# Patient Record
Sex: Female | Born: 1970 | Race: White | Hispanic: No | Marital: Married | Smoking: Current every day smoker
Health system: Southern US, Community
[De-identification: ages and names within clinical notes are randomized; demographics above are authoritative.]

## PROBLEM LIST (undated history)

## (undated) DIAGNOSIS — F419 Anxiety disorder, unspecified: Secondary | ICD-10-CM

## (undated) DIAGNOSIS — I10 Essential (primary) hypertension: Secondary | ICD-10-CM

## (undated) DIAGNOSIS — M1990 Unspecified osteoarthritis, unspecified site: Secondary | ICD-10-CM

## (undated) DIAGNOSIS — F32A Depression, unspecified: Secondary | ICD-10-CM

## (undated) DIAGNOSIS — G4730 Sleep apnea, unspecified: Secondary | ICD-10-CM

## (undated) DIAGNOSIS — F329 Major depressive disorder, single episode, unspecified: Secondary | ICD-10-CM

## (undated) HISTORY — DX: Sleep apnea, unspecified: G47.30

## (undated) HISTORY — DX: Essential (primary) hypertension: I10

## (undated) HISTORY — DX: Depression, unspecified: F32.A

## (undated) HISTORY — PX: SALIVARY GLAND SURGERY: SHX768

## (undated) HISTORY — DX: Unspecified osteoarthritis, unspecified site: M19.90

## (undated) HISTORY — DX: Anxiety disorder, unspecified: F41.9

## (undated) HISTORY — DX: Major depressive disorder, single episode, unspecified: F32.9

---

## 2002-10-06 HISTORY — PX: OTHER SURGICAL HISTORY: SHX169

## 2007-10-07 HISTORY — PX: ABLATION: SHX5711

## 2017-04-10 ENCOUNTER — Encounter: Payer: Self-pay | Admitting: Family

## 2017-04-10 ENCOUNTER — Ambulatory Visit (INDEPENDENT_AMBULATORY_CARE_PROVIDER_SITE_OTHER): Payer: BLUE CROSS/BLUE SHIELD | Admitting: Family

## 2017-04-10 VITALS — BP 133/94 | HR 79 | Temp 97.1°F | Ht 62.0 in | Wt 256.8 lb

## 2017-04-10 DIAGNOSIS — J301 Allergic rhinitis due to pollen: Secondary | ICD-10-CM

## 2017-04-10 DIAGNOSIS — H9202 Otalgia, left ear: Secondary | ICD-10-CM

## 2017-04-10 MED ORDER — CETIRIZINE HCL 10 MG PO TABS: 10.0000 mg | ORAL_TABLET | Freq: Every day | ORAL | 11 refills | Status: DC

## 2017-04-10 MED ORDER — FLUTICASONE PROPIONATE 50 MCG/ACT NA SUSP: 2.0000 | Freq: Every day | NASAL | 6 refills | Status: DC

## 2017-04-10 NOTE — Progress Notes (Signed)
   Subjective:    Patient ID: Kaitlin Evans, female    DOB: 11/22/1970, 46 y.o.   MRN: [id number]  Otalgia   There is pain in the left ear. This is a recurrent problem. The current episode started in the past 7 days. The problem occurs every few minutes. The problem has been waxing and waning. There has been no fever. The pain is at a severity of 6/10. The pain is moderate. Pertinent negatives include no coughing, diarrhea, ear discharge, headaches, hearing loss, rhinorrhea or sore throat. She has tried NSAIDs and acetaminophen for the symptoms. The treatment provided mild relief.      Review of Systems  HENT: Positive for ear pain. Negative for ear discharge, hearing loss, rhinorrhea and sore throat.   Respiratory: Negative for cough.   Gastrointestinal: Negative for diarrhea.  Neurological: Negative for headaches.  All other systems reviewed and are negative.  Social History   Social History  . Marital status: Married    Spouse name: N/A  . Number of children: N/A  . Years of education: N/A   Social History Main Topics  . Smoking status: Former Smoker    Quit date: 04/11/2015  . Smokeless tobacco: Never Used  . Alcohol use No  . Drug use: No  . Sexual activity: Not Asked   Other Topics Concern  . None   Social History Narrative  . None   .    Objective:   Physical Exam  Constitutional: She is oriented to person, place, and time. She appears well-developed and well-nourished. No distress.  HENT:  Head: Normocephalic and atraumatic.  Left Ear: There is tenderness. A middle ear effusion is present.  Nose: Nose normal.  Mouth/Throat: Oropharynx is clear and moist.  Eyes: Pupils are equal, round, and reactive to light.  Neck: Normal range of motion. Neck supple. No thyromegaly present.  Cardiovascular: Normal rate, regular rhythm, normal heart sounds and intact distal pulses.   No murmur heard. Pulmonary/Chest: Effort normal and breath sounds normal. No respiratory  distress. She has no wheezes.  Abdominal: Soft. Bowel sounds are normal. She exhibits no distension. There is no tenderness.  Musculoskeletal: Normal range of motion. She exhibits no edema or tenderness.  Neurological: She is alert and oriented to person, place, and time.  Skin: Skin is warm and dry.  Psychiatric: She has a normal mood and affect. Her behavior is normal. Judgment and thought content normal.  Vitals reviewed.    BP (!) 133/94   Pulse 79   Temp (!) 97.1 F (36.2 C)   Ht 5\' 2"  (1.575 m)   Wt 256 lb 12.8 oz (116.5 kg)   BMI 46.97 kg/m      Assessment & Plan:  1. Left ear pain - fluticasone (FLONASE) 50 MCG/ACT nasal spray; Place 2 sprays into both nostrils daily.  Dispense: 16 g; Refill: 6 - cetirizine (ZYRTEC) 10 MG tablet; Take 1 tablet (10 mg total) by mouth daily.  Dispense: 30 tablet; Refill: 11  2. Allergic rhinitis due to pollen, unspecified seasonality - fluticasone (FLONASE) 50 MCG/ACT nasal spray; Place 2 sprays into both nostrils daily.  Dispense: 16 g; Refill: 6 - cetirizine (ZYRTEC) 10 MG tablet; Take 1 tablet (10 mg total) by mouth daily.  Dispense: 30 tablet; Refill: 11  Will try flonase and zyrtec Tylenol as needed RTO if pain does not improve or worsens   Jannifer Rodneyhristy Defne Gerling, FNP

## 2017-04-10 NOTE — Patient Instructions (Signed)

## 2017-04-21 ENCOUNTER — Other Ambulatory Visit: Payer: Self-pay | Admitting: Family

## 2017-04-21 MED ORDER — AMOXICILLIN 875 MG PO TABS: 875.0000 mg | ORAL_TABLET | Freq: Two times a day (BID) | ORAL | 0 refills | Status: DC

## 2017-04-21 NOTE — Telephone Encounter (Signed)
Amoxicillin Prescription sent to pharmacy.

## 2017-04-21 NOTE — Telephone Encounter (Signed)
Patient aware that rx sent to pharmacy. 

## 2017-07-27 ENCOUNTER — Ambulatory Visit (INDEPENDENT_AMBULATORY_CARE_PROVIDER_SITE_OTHER): Payer: 59 | Admitting: Family Medicine

## 2017-07-27 ENCOUNTER — Encounter: Payer: Self-pay | Admitting: Family Medicine

## 2017-07-27 VITALS — BP 120/84 | HR 83 | Temp 98.1°F | Ht 62.0 in | Wt 251.5 lb

## 2017-07-27 DIAGNOSIS — M545 Low back pain, unspecified: Secondary | ICD-10-CM | POA: Insufficient documentation

## 2017-07-27 DIAGNOSIS — Z23 Encounter for immunization: Secondary | ICD-10-CM

## 2017-07-27 DIAGNOSIS — M5442 Lumbago with sciatica, left side: Secondary | ICD-10-CM

## 2017-07-27 DIAGNOSIS — F419 Anxiety disorder, unspecified: Secondary | ICD-10-CM | POA: Diagnosis not present

## 2017-07-27 DIAGNOSIS — Z13220 Encounter for screening for lipoid disorders: Secondary | ICD-10-CM | POA: Diagnosis not present

## 2017-07-27 DIAGNOSIS — I10 Essential (primary) hypertension: Secondary | ICD-10-CM

## 2017-07-27 MED ORDER — LOSARTAN POTASSIUM-HCTZ 100-25 MG PO TABS: 1.0000 | ORAL_TABLET | Freq: Every day | ORAL | 3 refills | Status: DC

## 2017-07-27 MED ORDER — ALPRAZOLAM 0.25 MG PO TABS: 0.2500 mg | ORAL_TABLET | ORAL | 2 refills | Status: DC | PRN

## 2017-07-27 MED ORDER — CYCLOBENZAPRINE HCL 10 MG PO TABS: 10.0000 mg | ORAL_TABLET | Freq: Two times a day (BID) | ORAL | 2 refills | Status: DC | PRN

## 2017-07-27 MED ORDER — DICLOFENAC SODIUM 1 % TD GEL: 2.0000 g | Freq: Four times a day (QID) | TRANSDERMAL | 5 refills | Status: DC

## 2017-07-27 NOTE — Progress Notes (Signed)
BP 120/84   Pulse 83   Temp 98.1 F (36.7 C) (Oral)   Ht '5\' 2"'  (1.575 m)   Wt 251 lb 8 oz (114.1 kg)   BMI 46.00 kg/m    Subjective:    Patient ID: Kaitlin Evans, female    DOB: September 20, 1971, 46 y.o.   MRN: [id number]  HPI: [name] is a 46 y.o. female presenting on 07/27/2017 for Establish Care   HPI Hypertension Patient is currently on losartan-hydrochlorothiazide, and their blood pressure today is 120/84. Patient denies any lightheadedness or dizziness. Patient denies headaches, blurred vision, chest pains, shortness of breath, or weakness. Denies any side effects from medication and is content with current medication.   Anxiety Patient comes in complaining of anxiety and is coming in to establish care with [place] as her PCP.  She has been using Xanax 0.25 mg as needed for anxiety and very in frequently does she need it.  She says she will use sometimes 1 or 2 a month.  She only uses it when it is most needed and she says it does really calm her down.  She denies full-blown panic attacks but just says it starts to build up towards that direction and then she has to take a treatment for it.  She denies any feelings of depression or sadness or suicidal ideations. Depression screen [facility] 2/9 07/27/2017 04/10/2017  Decreased Interest 0 0  Down, Depressed, Hopeless 1 0  PHQ - 2 Score 1 0    Low back pain Patient complains of left low back pain that sometimes comes on to the right.  She denies any radiation down either legs.  She gets this occasionally with certain use of movements.  She has used some over-the-counter topical agents that helps some she has not used ibuprofen too much because she is more sensitive in the stomach.  She denies any fevers or chills or redness or warmth.  Weakness  Relevant past medical, surgical, family and social history reviewed and updated as indicated. Interim medical history since our last visit reviewed. Allergies and medications reviewed and  updated.  Review of Systems  Constitutional: Negative for chills and fever.  Eyes: Negative for visual disturbance.  Respiratory: Negative for chest tightness and shortness of breath.   Cardiovascular: Negative for chest pain and leg swelling.  Musculoskeletal: Positive for back pain. Negative for gait problem and myalgias.  Skin: Negative for rash.  Neurological: Negative for weakness, light-headedness, numbness and headaches.  Psychiatric/Behavioral: Positive for decreased concentration and dysphoric mood. Negative for agitation, behavioral problems, self-injury, sleep disturbance and suicidal ideas. The patient is nervous/anxious.   All other systems reviewed and are negative.   Per HPI unless specifically indicated above  Social History   Social History  . Marital status: Married    Spouse name: N/A  . Number of children: N/A  . Years of education: N/A   Occupational History  . Not on file.   Social History Main Topics  . Smoking status: Smoker, Current Status Unknown    Packs/day: 0.50    Years: 18.00    Types: Cigarettes    Last attempt to quit: 04/11/2015  . Smokeless tobacco: Never Used  . Alcohol use Yes     Comment: occasional  . Drug use: No  . Sexual activity: Yes    Birth control/ protection: Surgical     Comment: surgery endometrial ablation 2009   Other Topics Concern  . Not on file   Social History Narrative  .  No narrative on file    Past Surgical History:  Procedure Laterality Date  . ABLATION  2009  . CESAREAN SECTION  2005  . CESAREAN SECTION  2008  . D & C  2004  . SALIVARY GLAND SURGERY     stones, had it removed    Family History  Problem Relation Age of Onset  . Hypertension Mother   . Early death Father   . Hypertension Brother   . Diabetes Brother   . Hypertension Maternal Grandmother   . Hypertension Paternal Grandmother   . Heart disease Paternal Grandfather   . Early death Paternal Grandfather     Allergies as of  07/27/2017      Reactions   Sulfa Antibiotics Nausea And Vomiting   Latex Rash      Medication List       Accurate as of 07/27/17  9:39 AM. Always use your most recent med list.          ALPRAZolam 0.25 MG tablet Commonly known as:  XANAX Take 1 tablet (0.25 mg total) by mouth as needed for anxiety.   cetirizine 10 MG tablet Commonly known as:  ZYRTEC Take 1 tablet (10 mg total) by mouth daily.   cyclobenzaprine 10 MG tablet Commonly known as:  FLEXERIL Take 1 tablet (10 mg total) by mouth 2 (two) times daily as needed for muscle spasms.   diclofenac sodium 1 % Gel Commonly known as:  VOLTAREN Apply 2 g topically 4 (four) times daily.   fluticasone 50 MCG/ACT nasal spray Commonly known as:  FLONASE Place 2 sprays into both nostrils daily.   losartan-hydrochlorothiazide 100-25 MG tablet Commonly known as:  HYZAAR Take 1 tablet by mouth daily.          Objective:    BP 120/84   Pulse 83   Temp 98.1 F (36.7 C) (Oral)   Ht '5\' 2"'  (1.575 m)   Wt 251 lb 8 oz (114.1 kg)   BMI 46.00 kg/m   Wt Readings from Last 3 Encounters:  07/27/17 251 lb 8 oz (114.1 kg)  04/10/17 256 lb 12.8 oz (116.5 kg)    Physical Exam  Constitutional: She is oriented to person, place, and time. She appears well-developed and well-nourished. No distress.  Eyes: Conjunctivae are normal.  Neck: Neck supple. No thyromegaly present.  Cardiovascular: Normal rate, regular rhythm, normal heart sounds and intact distal pulses.   No murmur heard. Pulmonary/Chest: Effort normal and breath sounds normal. No respiratory distress. She has no wheezes. She has no rales.  Musculoskeletal: Normal range of motion. She exhibits no edema.       Lumbar back: She exhibits tenderness. She exhibits normal range of motion, no bony tenderness and no swelling.       Back:  Lymphadenopathy:    She has no cervical adenopathy.  Neurological: She is alert and oriented to person, place, and time. Coordination  normal.  Skin: Skin is warm and dry. No rash noted. She is not diaphoretic.  Psychiatric: Her behavior is normal. Judgment normal. Her mood appears anxious. She exhibits a depressed mood. She expresses no suicidal ideation. She expresses no suicidal plans.  Nursing note and vitals reviewed.   No results found for this or any previous visit.    Assessment & Plan:   Problem List Items Addressed This Visit      Cardiovascular and Mediastinum   Hypertension - Primary   Relevant Medications   losartan-hydrochlorothiazide (HYZAAR) 100-25 MG tablet  Other Relevant Orders   CMP14+EGFR (Completed)     Other   Low back pain   Relevant Medications   cyclobenzaprine (FLEXERIL) 10 MG tablet   diclofenac sodium (VOLTAREN) 1 % GEL   Anxiety   Relevant Medications   ALPRAZolam (XANAX) 0.25 MG tablet    Other Visit Diagnoses    Lipid screening       Relevant Orders   Lipid panel (Completed)   Need for immunization against influenza       Relevant Orders   Flu Vaccine QUAD 36+ mos IM (Completed)       Follow up plan: Return in about 6 months (around 01/25/2018), or if symptoms worsen or fail to improve, for htn, .  Caryl Pina, MD [facility] 07/27/2017, 9:39 AM

## 2017-07-28 LAB — CMP14+EGFR
ALT: 21 IU/L (ref 0–32)
AST: 18 IU/L (ref 0–40)
Albumin/Globulin Ratio: 1.5 (ref 1.2–2.2)
Albumin: 4 g/dL (ref 3.5–5.5)
Alkaline Phosphatase: 104 IU/L (ref 39–117)
BUN/Creatinine Ratio: 14 (ref 9–23)
BUN: 10 mg/dL (ref 6–24)
Bilirubin Total: 0.4 mg/dL (ref 0.0–1.2)
CALCIUM: 9.5 mg/dL (ref 8.7–10.2)
CO2: 24 mmol/L (ref 20–29)
CREATININE: 0.74 mg/dL (ref 0.57–1.00)
Chloride: 100 mmol/L (ref 96–106)
GFR calc Af Amer: 115 mL/min/{1.73_m2} (ref >59)
GFR, EST NON AFRICAN AMERICAN: 100 mL/min/{1.73_m2} (ref >59)
Globulin, Total: 2.6 g/dL (ref 1.5–4.5)
Glucose: 96 mg/dL (ref 65–99)
Potassium: 4 mmol/L (ref 3.5–5.2)
Sodium: 139 mmol/L (ref 134–144)
TOTAL PROTEIN: 6.6 g/dL (ref 6.0–8.5)

## 2017-07-28 LAB — LIPID PANEL
CHOL/HDL RATIO: 4.2 ratio (ref 0.0–4.4)
Cholesterol, Total: 178 mg/dL (ref 100–199)
HDL: 42 mg/dL (ref >39)
LDL Calculated: 114 mg/dL — ABNORMAL HIGH (ref 0–99)
TRIGLYCERIDES: 110 mg/dL (ref 0–149)
VLDL CHOLESTEROL CAL: 22 mg/dL (ref 5–40)

## 2017-09-18 ENCOUNTER — Ambulatory Visit: Payer: 59 | Admitting: Family Medicine

## 2017-09-18 ENCOUNTER — Encounter: Payer: Self-pay | Admitting: Family Medicine

## 2017-09-18 VITALS — BP 136/91 | HR 99 | Temp 97.0°F | Ht 62.0 in | Wt 253.0 lb

## 2017-09-18 DIAGNOSIS — J0100 Acute maxillary sinusitis, unspecified: Secondary | ICD-10-CM

## 2017-09-18 MED ORDER — FLUCONAZOLE 150 MG PO TABS: ORAL_TABLET | ORAL | 0 refills | Status: DC

## 2017-09-18 MED ORDER — HYDROCODONE-HOMATROPINE 5-1.5 MG/5ML PO SYRP: 5.0000 mL | ORAL_SOLUTION | Freq: Four times a day (QID) | ORAL | 0 refills | Status: DC | PRN

## 2017-09-18 MED ORDER — AMOXICILLIN-POT CLAVULANATE 875-125 MG PO TABS: 1.0000 | ORAL_TABLET | Freq: Two times a day (BID) | ORAL | 0 refills | Status: DC

## 2017-09-18 NOTE — Patient Instructions (Signed)
Great to see you!   Sinusitis, Adult Sinusitis is soreness and inflammation of your sinuses. Sinuses are hollow spaces in the bones around your face. They are located:  Around your eyes.  In the middle of your forehead.  Behind your nose.  In your cheekbones.  Your sinuses and nasal passages are lined with a stringy fluid (mucus). Mucus normally drains out of your sinuses. When your nasal tissues get inflamed or swollen, the mucus can get trapped or blocked so air cannot flow through your sinuses. This lets bacteria, viruses, and funguses grow, and that leads to infection. Follow these instructions at home: Medicines  Take, use, or apply over-the-counter and prescription medicines only as told by your doctor. These may include nasal sprays.  If you were prescribed an antibiotic medicine, take it as told by your doctor. Do not stop taking the antibiotic even if you start to feel better. Hydrate and Humidify  Drink enough water to keep your pee (urine) clear or pale yellow.  Use a cool mist humidifier to keep the humidity level in your home above 50%.  Breathe in steam for 10-15 minutes, 3-4 times a day or as told by your doctor. You can do this in the bathroom while a hot shower is running.  Try not to spend time in cool or dry air. Rest  Rest as much as possible.  Sleep with your head raised (elevated).  Make sure to get enough sleep each night. General instructions  Put a warm, moist washcloth on your face 3-4 times a day or as told by your doctor. This will help with discomfort.  Wash your hands often with soap and water. If there is no soap and water, use hand sanitizer.  Do not smoke. Avoid being around people who are smoking (secondhand smoke).  Keep all follow-up visits as told by your doctor. This is important. Contact a doctor if:  You have a fever.  Your symptoms get worse.  Your symptoms do not get better within 10 days. Get help right away if:  You  have a very bad headache.  You cannot stop throwing up (vomiting).  You have pain or swelling around your face or eyes.  You have trouble seeing.  You feel confused.  Your neck is stiff.  You have trouble breathing. This information is not intended to replace advice given to you by your health care provider. Make sure you discuss any questions you have with your health care provider. Document Released: 03/10/2008 Document Revised: 05/18/2016 Document Reviewed: 07/18/2015 Elsevier Interactive Patient Education  2018 Elsevier Inc.  

## 2017-09-18 NOTE — Progress Notes (Signed)
   HPI  Patient presents today here with cough.  Patient complains of about 2 weeks of symptoms.  She is tried over-the-counter Mucinex without any improvement.  She is also tried NyQuil.  She complains of deep nonproductive cough.  She has bilateral rib pain with cough. She also has sinus pain and pressure.   PMH: Smoking status noted ROS: Per HPI  Objective: BP (!) 136/91   Pulse 99   Temp (!) 97 F (36.1 C) (Oral)   Ht 5\' 2"  (1.575 m)   Wt 253 lb (114.8 kg)   BMI 46.27 kg/m  Gen: NAD, alert, cooperative with exam HEENT: NCAT, tenderness to palpation of bilateral maxillary sinuses, oropharynx moist and clear, nares with turbinates swollen bilaterally CV: RRR, good S1/S2, no murmur Resp: CTABL, no wheezes, non-labored Ext: No edema, warm Neuro: Alert and oriented, No gross deficits  Assessment and plan:  #Acute sinusitis With possible underlying pneumonia, treated with Augmentin plus Diflucan. Also recommended Flonase Also given Hycodan for nighttime cough    Murtis SinkSam Bradshaw, MD [facility] 09/18/2017, 4:33 PM

## 2017-09-30 ENCOUNTER — Telehealth: Payer: Self-pay | Admitting: Family Medicine

## 2017-09-30 MED ORDER — AMOXICILLIN 500 MG PO CAPS: 1000.0000 mg | ORAL_CAPSULE | Freq: Two times a day (BID) | ORAL | 0 refills | Status: DC

## 2017-09-30 MED ORDER — CLINDAMYCIN HCL 300 MG PO CAPS
300.0000 mg | ORAL_CAPSULE | Freq: Three times a day (TID) | ORAL | 0 refills | Status: DC
Start: 2017-09-30 — End: 2017-12-31

## 2017-09-30 NOTE — Telephone Encounter (Signed)
What symptoms do you have? Sinus pressure and pain and cough and earache  How long have you been sick? First of December  Have you been seen for this problem? YES  If your provider decides to give you a prescription, which pharmacy would you like for it to be sent to[facility] [place]   Patient informed that this information will be sent to the clinical staff for review and that they should receive a follow up call.

## 2017-09-30 NOTE — Telephone Encounter (Signed)
Patient aware.

## 2017-09-30 NOTE — Telephone Encounter (Signed)
Please review and advise.

## 2017-09-30 NOTE — Telephone Encounter (Signed)
Patient aware- states she would like something stronger than amoxicillin sent to pharmacy since she finished Augmentin and it did not get rid of it. Please advise and send back to pools.

## 2017-10-01 ENCOUNTER — Telehealth: Payer: Self-pay | Admitting: Family

## 2017-10-01 MED ORDER — AZITHROMYCIN 250 MG PO TABS: ORAL_TABLET | ORAL | 0 refills | Status: DC

## 2017-10-01 NOTE — Telephone Encounter (Signed)
Refer to phone note from yesterday- please advise

## 2017-10-01 NOTE — Telephone Encounter (Signed)
Patient aware.

## 2017-10-01 NOTE — Telephone Encounter (Signed)
Zpak Prescription sent to pharmacy. If her symptoms do not improve will need to be seen. I added Clindamycin to allergy list.

## 2017-12-31 ENCOUNTER — Ambulatory Visit (INDEPENDENT_AMBULATORY_CARE_PROVIDER_SITE_OTHER): Payer: Self-pay | Admitting: Family

## 2017-12-31 ENCOUNTER — Encounter: Payer: Self-pay | Admitting: Family

## 2017-12-31 VITALS — BP 126/81 | HR 90 | Temp 97.2°F | Ht 62.0 in | Wt 257.8 lb

## 2017-12-31 DIAGNOSIS — J40 Bronchitis, not specified as acute or chronic: Secondary | ICD-10-CM

## 2017-12-31 DIAGNOSIS — J329 Chronic sinusitis, unspecified: Secondary | ICD-10-CM

## 2017-12-31 MED ORDER — AZITHROMYCIN 250 MG PO TABS: ORAL_TABLET | ORAL | 0 refills | Status: DC

## 2017-12-31 NOTE — Progress Notes (Signed)
   Subjective:    Patient ID: Kaitlin Evans, female    DOB: 05/25/1971, 47 y.o.   MRN: [id number]  Sinus Problem  Associated symptoms include chills, coughing, ear pain, headaches and a sore throat.  Cough  This is a new problem. The current episode started 1 to 4 weeks ago. The problem has been gradually worsening. The problem occurs every few minutes. The cough is non-productive. Associated symptoms include chills, ear congestion, ear pain, headaches, myalgias, nasal congestion, postnasal drip, rhinorrhea, a sore throat and wheezing. Pertinent negatives include no fever. The symptoms are aggravated by pollens and lying down. She has tried OTC cough suppressant for the symptoms. The treatment provided mild relief. There is no history of asthma.      Review of Systems  Constitutional: Positive for chills. Negative for fever.  HENT: Positive for ear pain, postnasal drip, rhinorrhea and sore throat.   Respiratory: Positive for cough and wheezing.   Musculoskeletal: Positive for myalgias.  Neurological: Positive for headaches.  All other systems reviewed and are negative.      Objective:   Physical Exam  Constitutional: She is oriented to person, place, and time. She appears well-developed and well-nourished. No distress.  HENT:  Head: Normocephalic and atraumatic.  Right Ear: External ear normal.  Left Ear: External ear normal.  Nose: Mucosal edema and rhinorrhea present. Right sinus exhibits frontal sinus tenderness. Left sinus exhibits frontal sinus tenderness.  Mouth/Throat: Oropharynx is clear and moist.  Eyes: Pupils are equal, round, and reactive to light.  Neck: Normal range of motion. Neck supple. No thyromegaly present.  Cardiovascular: Normal rate, regular rhythm, normal heart sounds and intact distal pulses.  No murmur heard. Pulmonary/Chest: Effort normal and breath sounds normal. No respiratory distress. She has no wheezes.  Intermittent nonproductive   Abdominal:  Soft. Bowel sounds are normal. She exhibits no distension. There is no tenderness.  Musculoskeletal: Normal range of motion. She exhibits no edema or tenderness.  Neurological: She is alert and oriented to person, place, and time.  Skin: Skin is warm and dry.  Psychiatric: She has a normal mood and affect. Her behavior is normal. Judgment and thought content normal.  Vitals reviewed.     BP 126/81   Pulse 90   Temp (!) 97.2 F (36.2 C) (Oral)   Ht 5\' 2"  (1.575 m)   Wt 257 lb 12.8 oz (116.9 kg)   BMI 47.15 kg/m      Assessment & Plan:  1. Sinobronchitis - Take meds as prescribed - Use a cool mist humidifier  -Use saline nose sprays frequently -Force fluids -For any cough or congestion  Use plain Mucinex- regular strength or max strength is fine -For fever or aces or pains- take tylenol or ibuprofen. -Throat lozenges if help - azithromycin (ZITHROMAX) 250 MG tablet; Take 500 mg once, then 250 mg for four days  Dispense: 6 tablet; Refill: 0  2. Morbid obesity (HCC)   Jannifer Rodneyhristy Yesenia Fontenette, FNP

## 2017-12-31 NOTE — Patient Instructions (Signed)

## 2018-03-05 ENCOUNTER — Encounter: Payer: Self-pay | Admitting: Pediatrics

## 2018-03-05 ENCOUNTER — Telehealth: Payer: Self-pay | Admitting: *Deleted

## 2018-03-05 ENCOUNTER — Ambulatory Visit: Payer: BLUE CROSS/BLUE SHIELD | Admitting: Pediatrics

## 2018-03-05 VITALS — BP 123/85 | HR 92 | Temp 97.5°F | Wt 262.8 lb

## 2018-03-05 DIAGNOSIS — J029 Acute pharyngitis, unspecified: Secondary | ICD-10-CM | POA: Diagnosis not present

## 2018-03-05 LAB — RAPID STREP SCREEN (MED CTR MEBANE ONLY): Strep Gp A Ag, IA W/Reflex: NEGATIVE

## 2018-03-05 LAB — CULTURE, GROUP A STREP

## 2018-03-05 MED ORDER — AZITHROMYCIN 250 MG PO TABS: ORAL_TABLET | ORAL | 0 refills | Status: DC

## 2018-03-05 NOTE — Telephone Encounter (Signed)
Patient's son was seen today and diagnosed with Strep.  Mother states that she has a sore throat and fatigue and would like to know if something could be sent in for her

## 2018-03-05 NOTE — Progress Notes (Signed)
  Subjective:   Patient ID: Kaitlin Evans, female    DOB: 12/20/1970, 47 y.o.   MRN: [id number] CC: Sore Throat (fatigue, started on Wed, denies fever or h/a)  HPI: Kaitlin HockMarianne Bilotta is a 47 y.o. female   Started having sore throat this morning.  Son diagnosed earlier today with strep pharyngitis.  He has been drinking out of mom's drinks. More fatigued than usual, appetite is been down.  Some congestion.  No coughing.  Ears feel full.  Relevant past medical, surgical, family and social history reviewed. Allergies and medications reviewed and updated. Social History   Tobacco Use  Smoking Status Former Smoker  . Packs/day: 0.50  . Years: 18.00  . Pack years: 9.00  . Types: Cigarettes  . Last attempt to quit: 04/11/2015  . Years since quitting: 2.9  Smokeless Tobacco Never Used   ROS: Per HPI   Objective:    BP 123/85 (BP Location: Left Arm, Patient Position: Sitting, Cuff Size: Large)   Pulse 92   Temp (!) 97.5 F (36.4 C) (Oral)   Wt 262 lb 12.8 oz (119.2 kg)   BMI 48.07 kg/m   Wt Readings from Last 3 Encounters:  03/05/18 262 lb 12.8 oz (119.2 kg)  12/31/17 257 lb 12.8 oz (116.9 kg)  09/18/17 253 lb (114.8 kg)    Gen: NAD, alert, cooperative with exam, NCAT EYES: EOMI, no conjunctival injection, or no icterus ENT: Left TM with white effusion, right TM dull.  OP with mild erythema LYMPH: no cervical LAD CV: NRRR, normal S1/S2, no murmur, distal pulses 2+ b/l Resp: CTABL, no wheezes, normal WOB Ext: No edema, warm Neuro: Alert and oriented, strength equal b/l UE and LE, coordination grossly normal MSK: normal muscle bulk  Assessment & Plan:  Clerance LavMarianne was seen today for sore throat.  Diagnoses and all orders for this visit:  Pharyngitis, unspecified etiology We will treat with below given recent definite exposure to strep.  Discussed symptom care including daily antihistamine, Flonase. -     azithromycin (ZITHROMAX) 250 MG tablet; Take 2 the first day and then one  each day after.   Follow up plan: As needed Rex Krasarol Vincent, MD [facility]

## 2018-05-22 ENCOUNTER — Encounter: Payer: Self-pay | Admitting: Family Medicine

## 2018-05-22 ENCOUNTER — Ambulatory Visit: Payer: BLUE CROSS/BLUE SHIELD | Admitting: Family Medicine

## 2018-05-22 VITALS — BP 122/82 | HR 94 | Temp 97.2°F | Ht 62.0 in | Wt 265.0 lb

## 2018-05-22 DIAGNOSIS — F411 Generalized anxiety disorder: Secondary | ICD-10-CM | POA: Diagnosis not present

## 2018-05-22 DIAGNOSIS — F3341 Major depressive disorder, recurrent, in partial remission: Secondary | ICD-10-CM | POA: Diagnosis not present

## 2018-05-22 DIAGNOSIS — F410 Panic disorder [episodic paroxysmal anxiety] without agoraphobia: Secondary | ICD-10-CM

## 2018-05-22 MED ORDER — FLUOXETINE HCL 20 MG PO TABS: 20.0000 mg | ORAL_TABLET | Freq: Every day | ORAL | 1 refills | Status: DC

## 2018-05-22 MED ORDER — ALPRAZOLAM 0.5 MG PO TABS: 0.2500 mg | ORAL_TABLET | Freq: Two times a day (BID) | ORAL | 1 refills | Status: DC | PRN

## 2018-05-22 NOTE — Patient Instructions (Signed)
Taking the medicine as directed and not missing any doses is one of the best things you can do to treat your anxiety/ depression.  Here are some things to keep in mind:  1) Side effects (stomach upset, some increased anxiety) may happen before you notice a benefit.  These side effects typically go away over time. 2) Changes to your dose of medicine or a change in medication all together is sometimes necessary 3) Most people need to be on medication at least 12 months 4) Many people will notice an improvement within two weeks but the full effect of the medication can take up to 4-6 weeks 5) Stopping the medication when you start feeling better often results in a return of symptoms 6) Never discontinue your medication without contacting a health care professional first.  Some medications require gradual discontinuation/ taper and can make you sick if you stop them abruptly.  If your symptoms worsen or you have thoughts of suicide/homicide, PLEASE SEEK IMMEDIATE MEDICAL ATTENTION.  You may always call:  National Suicide Hotline: 859-047-7186(848) 317-5523 [place] Crisis Line: 240-543-4223(703) 062-5215 [facility] [place]: 939-077-15479044192514   These are available 24 hours a day, 7 days a week.  See me in 4-6 weeks for recheck or sooner if needed.

## 2018-05-22 NOTE — Progress Notes (Signed)
Subjective: CC: anxiety PCP: Junie SpencerHawks, Christy A, FNP ZOX:[id number]:Kaitlin Evans is a 47 y.o. female presenting to clinic today for:  1. Anxiety Patient with past medical history of anxiety disorder.  She was actually seen in October 2018 for this issue.  At that time, she was prescribed a small quantity of alprazolam 0.25 mg to use as needed.  She was given 2 additional refills but actually did not require refills of the medication.  She notes she took her last tablet yesterday.  She states that her anxiety has been much more prominent since finding out that her [place] from [place] did not apply to the job she recently applied for with [employer].  She describes difficulty falling asleep secondary to ruminating thoughts.  She notes sensation of panic.  Sometimes the sensations of panic are not resolved with the alprazolam 0.25 mg.  She states that that was the case at a recent football game that she attended.  She notes a long-standing history of anxiety and depression with predominance of anxiety symptoms.  She notes that she was initially diagnosed in 2009 and was on medication, Celexa, for quite some time.  She self discontinued over the last year because she was doing really well and felt like she did not need the medicine.  She states that she did feel not quite herself while on the Celexa and does not wish to be on medications if she does not have to.  Family history is significant for depressive disorder in her mother.  She thinks her brother may have had an alcohol use disorder.  And there were reports that her biological father who is now deceased had an alcohol use disorder.  She denies any use of drugs, alcohol or tobacco.  Denies any history of hospitalization for mental health disorder in the past.  No SI, HI, visual or auditory hallucinations.   ROS: Per HPI  Allergies  Allergen Reactions  . Sulfa Antibiotics Nausea And Vomiting  . Clindamycin/Lincomycin Rash  . Latex Rash   Past  Medical History:  Diagnosis Date  . Anxiety   . Arthritis   . Depression   . Hypertension   . Sleep apnea     Current Outpatient Medications:  .  ALPRAZolam (XANAX) 0.25 MG tablet, Take 1 tablet (0.25 mg total) by mouth as needed for anxiety., Disp: 15 tablet, Rfl: 2 .  azithromycin (ZITHROMAX) 250 MG tablet, Take 2 the first day and then one each day after., Disp: 6 tablet, Rfl: 0 .  cetirizine (ZYRTEC) 10 MG tablet, Take 1 tablet (10 mg total) by mouth daily. (Patient taking differently: Take 10 mg by mouth daily as needed. ), Disp: 30 tablet, Rfl: 11 .  cyclobenzaprine (FLEXERIL) 10 MG tablet, Take 1 tablet (10 mg total) by mouth 2 (two) times daily as needed for muscle spasms., Disp: 30 tablet, Rfl: 2 .  diclofenac sodium (VOLTAREN) 1 % GEL, Apply 2 g topically 4 (four) times daily., Disp: 100 g, Rfl: 5 .  fluticasone (FLONASE) 50 MCG/ACT nasal spray, Place 2 sprays into both nostrils daily. (Patient taking differently: Place 2 sprays into both nostrils daily as needed. ), Disp: 16 g, Rfl: 6 .  losartan-hydrochlorothiazide (HYZAAR) 100-25 MG tablet, Take 1 tablet by mouth daily., Disp: 90 tablet, Rfl: 3 Social History   Socioeconomic History  . Marital status: Married    Spouse name: Not on file  . Number of children: Not on file  . Years of education: Not on file  .  Highest education level: Not on file  Occupational History  . Not on file  Social Needs  . Financial resource strain: Not on file  . Food insecurity:    Worry: Not on file    Inability: Not on file  . Transportation needs:    Medical: Not on file    Non-medical: Not on file  Tobacco Use  . Smoking status: Former Smoker    Packs/day: 0.50    Years: 18.00    Pack years: 9.00    Types: Cigarettes    Last attempt to quit: 04/11/2015    Years since quitting: 3.1  . Smokeless tobacco: Never Used  Substance and Sexual Activity  . Alcohol use: Yes    Comment: occasional  . Drug use: No  . Sexual activity: Yes     Birth control/protection: Surgical    Comment: surgery endometrial ablation 2009  Lifestyle  . Physical activity:    Days per week: Not on file    Minutes per session: Not on file  . Stress: Not on file  Relationships  . Social connections:    Talks on phone: Not on file    Gets together: Not on file    Attends religious service: Not on file    Active member of club or organization: Not on file    Attends meetings of clubs or organizations: Not on file    Relationship status: Not on file  . Intimate partner violence:    Fear of current or ex partner: Not on file    Emotionally abused: Not on file    Physically abused: Not on file    Forced sexual activity: Not on file  Other Topics Concern  . Not on file  Social History Narrative  . Not on file   Family History  Problem Relation Age of Onset  . Hypertension Mother   . Early death Father   . Hypertension Brother   . Diabetes Brother   . Hypertension Maternal Grandmother   . Hypertension Paternal Grandmother   . Heart disease Paternal Grandfather   . Early death Paternal Grandfather     Objective: Office vital signs reviewed. BP 122/82   Pulse 94   Temp (!) 97.2 F (36.2 C) (Oral)   Ht 5\' 2"  (1.575 m)   Wt 265 lb (120.2 kg)   BMI 48.47 kg/m   Physical Examination:  General: Awake, alert, obese, anxious and tearful during exam HEENT: Normal    Neck: No goiter.    Eyes: extraocular membranes intact, sclera injected from crying; no exophthalmos    Throat: moist mucus membranes Cardio: regular rate and rhythm, S1S2 heard, no murmurs appreciated Pulm: clear to auscultation bilaterally, no wheezes, rhonchi or rales; normal work of breathing on room air Neuro: No focal neurologic deficits.  Follows commands. Psych: Patient is tearful.  Mood is slightly depressed.  She appears anxious on exam.  Eye contact initially is fair but by the end of the visit is good.  Patient appears to be much more at ease at the end of the  visit.  Does not appear to be responding to internal stimuli.  Depression screen [facility] 2/9 05/22/2018 12/31/2017 09/18/2017  Decreased Interest 1 0 0  Down, Depressed, Hopeless 1 0 0  PHQ - 2 Score 2 0 0  Altered sleeping 2 - -  Tired, decreased energy 2 - -  Change in appetite 2 - -  Feeling bad or failure about yourself  3 - -  Trouble  concentrating 1 - -  Moving slowly or fidgety/restless 1 - -  Suicidal thoughts 0 - -  PHQ-9 Score 13 - -   GAD 7 : Generalized Anxiety Score 05/22/2018  Nervous, Anxious, on Edge 3  Control/stop worrying 3  Worry too much - different things 3  Trouble relaxing 3  Restless 2  Easily annoyed or irritable 3  Afraid - awful might happen 2  Total GAD 7 Score 19  Anxiety Difficulty Very difficult   Mood questionnaire: negative.  Assessment/ Plan: 47 y.o. female   1. Generalized anxiety disorder with panic attacks Likely exacerbated by an acute stress reaction.  Patient scored a 19 on her gad 7 today.  We discussed that I do think that she would benefit from going back on an SSRI.  Since she had some blunting of her personality on Celexa, we discussed Prozac as an alternative.  I counseled her on the need to reject the stigma associated with use of anxiety/ depression medications.  I think that use of a benzodiazepine for as needed panic attacks is appropriate in this patient.  She is well versed on the side effects of benzodiazepines and from chart review appears to be using it very appropriately.  I did review the narcotic database in she indeed only filled it once in October despite having 2 refills on the medication.  She was given an increased dose of 0.5 mg to use half tablet to 1 tablet up to twice daily as needed panic or anxiety.  I suspect that she will not have a great need for this medication as she is stabilized on the Prozac but would likely benefit from it over the next few weeks while her dose is being titrated.  I encouraged her to call with any  questions or concerns.  The national suicide hotline, [place] crisis hotline and [facility] hotlines were also provided to the patient today.  She is to follow-up in about 4 to 6 weeks or sooner if needed for recheck.  2. Recurrent major depressive disorder, in partial remission (HCC) PHQ 9 score of 13 today.  Start Prozac 20 mg daily as above.  She will follow-up in 4 to 6 weeks.   Meds ordered this encounter  Medications  . ALPRAZolam (XANAX) 0.5 MG tablet    Sig: Take 0.5-1 tablets (0.25-0.5 mg total) by mouth 2 (two) times daily as needed for anxiety.    Dispense:  30 tablet    Refill:  1  . FLUoxetine (PROZAC) 20 MG tablet    Sig: Take 1 tablet (20 mg total) by mouth daily.    Dispense:  30 tablet    Refill:  1    The Narcotic Database has been reviewed.  There were no red flags.    Raliegh IpAshly M Banjamin Stovall, DO [facility] [phone]

## 2018-05-25 ENCOUNTER — Telehealth: Payer: Self-pay | Admitting: Family Medicine

## 2018-05-25 NOTE — Telephone Encounter (Signed)
Please advise on medication change

## 2018-05-26 MED ORDER — FLUOXETINE HCL 20 MG PO CAPS: 20.0000 mg | ORAL_CAPSULE | Freq: Every day | ORAL | 1 refills | Status: DC

## 2018-05-26 NOTE — Telephone Encounter (Signed)
I went ahead and took care of it and took tablet out. Thank you

## 2018-05-26 NOTE — Addendum Note (Signed)
Addended by: Cleda DaubUCKER, Gia Lusher G on: 05/26/2018 09:04 AM   Modules accepted: Orders

## 2018-05-26 NOTE — Telephone Encounter (Signed)
Pharmacy was calling about prozac not xanax. Just need it changed from tablets to capsules. I went ahead and sent the change to the pharmacy and notified patient.

## 2018-05-26 NOTE — Telephone Encounter (Signed)
And why is the pharmacy asking for a change on xanax?

## 2018-05-26 NOTE — Telephone Encounter (Signed)
Thank you and do I need to change it in the chart so that next refill will not have this problem

## 2018-06-22 ENCOUNTER — Telehealth: Payer: Self-pay | Admitting: Family

## 2018-06-22 NOTE — Telephone Encounter (Signed)
pLEASE ADVISE ON REFILL FOR MEDICATION.

## 2018-06-22 NOTE — Telephone Encounter (Signed)
PT was suppose to see Dr Nadine CountsGottschalk on 9/19 and had to cancel apt due to just starting a new job and she doesn't get off until around 445 and pt is requesting to come in to see dr Nadine Countsgottschalk in after hours clinic or on Saturday to get a refill on her prozac.  Pt states that she will need a refill on the prozac as well she only has 5 days worth, she uses [facility] [place]

## 2018-06-24 ENCOUNTER — Ambulatory Visit: Payer: BLUE CROSS/BLUE SHIELD | Admitting: Family Medicine

## 2018-06-24 MED ORDER — FLUOXETINE HCL 20 MG PO CAPS: 20.0000 mg | ORAL_CAPSULE | Freq: Every day | ORAL | 0 refills | Status: DC

## 2018-06-24 NOTE — Telephone Encounter (Signed)
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 It is ok to schedule her.  30 days sent to her pharmacy.

## 2018-06-24 NOTE — Telephone Encounter (Signed)
Apt made for tomorrow at 6pm.

## 2018-06-25 ENCOUNTER — Telehealth: Payer: Self-pay

## 2018-06-25 ENCOUNTER — Ambulatory Visit: Payer: BLUE CROSS/BLUE SHIELD

## 2018-06-25 NOTE — Telephone Encounter (Signed)
Patient would like to follow up with Dr. Louanne Skyeettinger about her anxiety. Wanting to come after house - please advise if that is okay

## 2018-06-28 NOTE — Telephone Encounter (Signed)
Yes patient can see me, I am on Friday night this week if she would like to see me after hours.

## 2018-06-28 NOTE — Telephone Encounter (Signed)
appt made for Friday = pt aware

## 2018-07-02 ENCOUNTER — Ambulatory Visit: Payer: BLUE CROSS/BLUE SHIELD | Admitting: Family Medicine

## 2018-07-02 ENCOUNTER — Encounter: Payer: Self-pay | Admitting: Family Medicine

## 2018-07-02 ENCOUNTER — Encounter (INDEPENDENT_AMBULATORY_CARE_PROVIDER_SITE_OTHER): Payer: Self-pay

## 2018-07-02 VITALS — BP 113/81 | HR 90 | Temp 97.0°F | Ht 62.0 in | Wt 264.0 lb

## 2018-07-02 DIAGNOSIS — I10 Essential (primary) hypertension: Secondary | ICD-10-CM

## 2018-07-02 DIAGNOSIS — F419 Anxiety disorder, unspecified: Secondary | ICD-10-CM

## 2018-07-02 DIAGNOSIS — F320 Major depressive disorder, single episode, mild: Secondary | ICD-10-CM

## 2018-07-02 MED ORDER — FLUOXETINE HCL 20 MG PO CAPS: 20.0000 mg | ORAL_CAPSULE | Freq: Every day | ORAL | 1 refills | Status: DC

## 2018-07-02 MED ORDER — CYCLOBENZAPRINE HCL 10 MG PO TABS: 10.0000 mg | ORAL_TABLET | Freq: Two times a day (BID) | ORAL | 2 refills | Status: DC | PRN

## 2018-07-02 MED ORDER — LOSARTAN POTASSIUM-HCTZ 100-25 MG PO TABS: 1.0000 | ORAL_TABLET | Freq: Every day | ORAL | 3 refills | Status: DC

## 2018-07-02 MED ORDER — ALPRAZOLAM 0.5 MG PO TABS: 0.2500 mg | ORAL_TABLET | Freq: Two times a day (BID) | ORAL | 2 refills | Status: DC | PRN

## 2018-07-02 NOTE — Progress Notes (Signed)
BP 113/81   Pulse 90   Temp (!) 97 F (36.1 C) (Oral)   Ht 5\' 2"  (1.575 m)   Wt 264 lb (119.7 kg)   BMI 48.29 kg/m    Subjective:    Patient ID: Kaitlin Evans, female    DOB: 03/27/1971, 47 y.o.   MRN: [id number]  HPI: Kaitlin Evans is a 47 y.o. female presenting on 07/02/2018 for Anxiety   HPI Anxiety depression recheck Patient is coming in today for anxiety depression recheck.  She has been using the Prozac and feels like things are going very well on the Prozac and she likes it a lot.  She denies any major side effects from the Prozac except the occasional GI discomfort which she is fine with and it has not bothered her too much.  She says she has used the alprazolam on occasion may be 1-2 times per week but is trying to keep down on the amount that she takes.  She says she is really feeling a lot better than she was.  She says she still has the occasional where the anxiety builds up or she has crying episodes but not anywhere near what she was having before Depression screen [facility] 2/9 07/02/2018 05/22/2018 12/31/2017 09/18/2017 07/27/2017  Decreased Interest 0 1 0 0 0  Down, Depressed, Hopeless 0 1 0 0 1  PHQ - 2 Score 0 2 0 0 1  Altered sleeping - 2 - - -  Tired, decreased energy - 2 - - -  Change in appetite - 2 - - -  Feeling bad or failure about yourself  - 3 - - -  Trouble concentrating - 1 - - -  Moving slowly or fidgety/restless - 1 - - -  Suicidal thoughts - 0 - - -  PHQ-9 Score - 13 - - -    Hypertension Patient is currently on losartan-hydrochlorthiazide, and their blood pressure today is 113/81. Patient denies any lightheadedness or dizziness. Patient denies headaches, blurred vision, chest pains, shortness of breath, or weakness. Denies any side effects from medication and is content with current medication.  Relevant past medical, surgical, family and social history reviewed and updated as indicated. Interim medical history since our last visit  reviewed. Allergies and medications reviewed and updated.  Review of Systems  Constitutional: Negative for chills and fever.  Eyes: Negative for visual disturbance.  Respiratory: Negative for chest tightness and shortness of breath.   Cardiovascular: Negative for chest pain and leg swelling.  Musculoskeletal: Negative for back pain and gait problem.  Skin: Negative for rash.  Neurological: Negative for light-headedness and headaches.  Psychiatric/Behavioral: Positive for dysphoric mood. Negative for agitation, behavioral problems, self-injury, sleep disturbance and suicidal ideas. The patient is nervous/anxious.   All other systems reviewed and are negative.   Per HPI unless specifically indicated above   Allergies as of 07/02/2018      Reactions   Sulfa Antibiotics Nausea And Vomiting   Clindamycin/lincomycin Rash   Latex Rash      Medication List        Accurate as of 07/02/18  6:18 PM. Always use your most recent med list.          ALPRAZolam 0.5 MG tablet Commonly known as:  XANAX Take 0.5-1 tablets (0.25-0.5 mg total) by mouth 2 (two) times daily as needed for anxiety.   cetirizine 10 MG tablet Commonly known as:  ZYRTEC Take 1 tablet (10 mg total) by mouth daily.   cyclobenzaprine 10  MG tablet Commonly known as:  FLEXERIL Take 1 tablet (10 mg total) by mouth 2 (two) times daily as needed for muscle spasms.   FLUoxetine 20 MG capsule Commonly known as:  PROZAC Take 1 capsule (20 mg total) by mouth daily.   fluticasone 50 MCG/ACT nasal spray Commonly known as:  FLONASE Place 2 sprays into both nostrils daily.   losartan-hydrochlorothiazide 100-25 MG tablet Commonly known as:  HYZAAR Take 1 tablet by mouth daily.          Objective:    BP 113/81   Pulse 90   Temp (!) 97 F (36.1 C) (Oral)   Ht 5\' 2"  (1.575 m)   Wt 264 lb (119.7 kg)   BMI 48.29 kg/m   Wt Readings from Last 3 Encounters:  07/02/18 264 lb (119.7 kg)  05/22/18 265 lb (120.2 kg)   03/05/18 262 lb 12.8 oz (119.2 kg)    Physical Exam  Constitutional: She is oriented to person, place, and time. She appears well-developed and well-nourished. No distress.  Eyes: Conjunctivae are normal.  Neck: Neck supple. No thyromegaly present.  Cardiovascular: Normal rate, regular rhythm, normal heart sounds and intact distal pulses.  No murmur heard. Pulmonary/Chest: Effort normal and breath sounds normal. No respiratory distress. She has no wheezes.  Lymphadenopathy:    She has no cervical adenopathy.  Neurological: She is alert and oriented to person, place, and time. Coordination normal.  Skin: Skin is warm and dry. No rash noted. She is not diaphoretic.  Psychiatric: She has a normal mood and affect. Her behavior is normal.  Nursing note and vitals reviewed.       Assessment & Plan:   Problem List Items Addressed This Visit      Cardiovascular and Mediastinum   Hypertension   Relevant Medications   losartan-hydrochlorothiazide (HYZAAR) 100-25 MG tablet     Other   Anxiety - Primary   Relevant Medications   FLUoxetine (PROZAC) 20 MG capsule   ALPRAZolam (XANAX) 0.5 MG tablet   Depression, major, single episode, mild (HCC)   Relevant Medications   FLUoxetine (PROZAC) 20 MG capsule   ALPRAZolam (XANAX) 0.5 MG tablet       Follow up plan: Return in about 3 months (around 10/01/2018), or if symptoms worsen or fail to improve, for Anxiety depression and hypertension follow-up.  Counseling provided for all of the vaccine components No orders of the defined types were placed in this encounter.   Arville Care, MD [facility] 07/02/2018, 6:18 PM

## 2018-07-28 ENCOUNTER — Telehealth: Payer: Self-pay | Admitting: Family Medicine

## 2018-07-28 NOTE — Telephone Encounter (Signed)
I spoke with pharmacy and they will give her the 2 separate losartan 100 and hydrochlorothiazide 25 mg and they will have them ready for her now.

## 2018-07-28 NOTE — Telephone Encounter (Signed)
Patient phone call on her Losartan-HCTZ 100-25 mg Please advise

## 2018-09-20 ENCOUNTER — Encounter: Payer: Self-pay | Admitting: Emergency Medicine

## 2018-09-20 ENCOUNTER — Emergency Department (INDEPENDENT_AMBULATORY_CARE_PROVIDER_SITE_OTHER)
Admission: EM | Admit: 2018-09-20 | Discharge: 2018-09-20 | Disposition: A | Discharge status: Home / Self Care | Payer: Commercial Managed Care - PPO | Source: Home / Self Care | Attending: Emergency Medicine | Admitting: Emergency Medicine

## 2018-09-20 ENCOUNTER — Ambulatory Visit: Payer: BLUE CROSS/BLUE SHIELD

## 2018-09-20 DIAGNOSIS — N12 Tubulo-interstitial nephritis, not specified as acute or chronic: Secondary | ICD-10-CM

## 2018-09-20 DIAGNOSIS — R300 Dysuria: Secondary | ICD-10-CM | POA: Diagnosis not present

## 2018-09-20 LAB — POCT URINALYSIS DIP (MANUAL ENTRY)
Bilirubin, UA: NEGATIVE
Blood, UA: NEGATIVE
Glucose, UA: NEGATIVE mg/dL
Ketones, POC UA: NEGATIVE mg/dL
Leukocytes, UA: NEGATIVE
Nitrite, UA: NEGATIVE
Protein Ur, POC: NEGATIVE mg/dL
Spec Grav, UA: 1.015 (ref 1.010–1.025)
Urobilinogen, UA: 0.2 E.U./dL
pH, UA: 7.5 (ref 5.0–8.0)

## 2018-09-20 MED ORDER — CEFTRIAXONE SODIUM 1 G IJ SOLR
1000.0000 mg | Freq: Once | INTRAMUSCULAR | Status: AC
  Administered 2018-09-20: 1000 mg via INTRAMUSCULAR

## 2018-09-20 MED ORDER — CIPROFLOXACIN HCL 500 MG PO TABS: 500.0000 mg | ORAL_TABLET | Freq: Two times a day (BID) | ORAL | 0 refills | Status: DC

## 2018-09-20 NOTE — Discharge Instructions (Addendum)
Diagnosis likely is urinary tract infection, possibly infection into left kidney which is called pyelonephritis. Also possible is left kidney stone, so you need to use the urine strainer and if we can obtain a kidney stone, will send for analysis. Treatment: We are sending a urine culture.  Rocephin (antibiotic) shot today.  Prescription for Cipro-antibiotic sent to your pharmacy.-I decided not to prescribe Flomax for urinary flow, as there are possible cross reactions between Flomax and Septra which you are allergic to. May use Tylenol for pain.  Drink plenty of fluids. Please read attached instruction/information sheets on pyelonephritis and renal colic/kidney stone. It is important you follow-up with your PCP within 1 week for recheck and to recheck a urinalysis. If you have severe or worsening symptoms, seek medical care or go to emergency room immediately.

## 2018-09-20 NOTE — ED Provider Notes (Signed)
[facility]    CSN: [id number] Arrival date & time: 09/20/18  4098     History   Chief Complaint Chief Complaint  Patient presents with  . Flank Pain    HPI Kaitlin Evans is a 47 y.o. female.    Flank Pain   This is a 47 y.o. female who presents today 2 days of sharp, mild-moderate left flank pain and urinary frequency.  Noted scant blood in urine this morning.  Has slight urgency but denies actual dysuria. The left flank pain is sharp, 4 out of 10 intensity.  May radiate to the left flank but not beyond.  She feels this is not muscular pain as it does not hurt to move or twist.  She tried Excedrin, and that helped a little. No vaginal discharge She feels that she felt warm, but denies actual documented fever.  No chills No definite lower abdominal pain No nausea, but decreased appetite but she is tolerating small amount of liquids and solids. No vomiting Has mild fatigue but no focal neurologic symptoms.  Had headache this morning that resolved after taking Excedrin.  No lightheadedness or syncope. She denies chance of pregnancy.-Had uterine ablation 2009 without any menstrual periods since then.  She denies history of kidney stones.  She states she had UTI few years ago but not recently.     Past Medical History:  Diagnosis Date  . Anxiety   . Arthritis   . Depression   . Hypertension   . Sleep apnea     Patient Active Problem List   Diagnosis Date Noted  . Depression, major, single episode, mild (HCC) 07/02/2018  . Morbid obesity (HCC) 12/31/2017  . Hypertension 07/27/2017  . Low back pain 07/27/2017  . Anxiety 07/27/2017    Past Surgical History:  Procedure Laterality Date  . ABLATION  2009  . CESAREAN SECTION  2005  . CESAREAN SECTION  2008  . D & C  2004  . SALIVARY GLAND SURGERY     stones, had it removed    OB History   No obstetric history on file.    Had uterine ablation 2009 without any menstrual periods since  then.   Home Medications    Prior to Admission medications   Medication Sig Start Date End Date Taking? Authorizing Provider  ALPRAZolam Prudy Feeler) 0.5 MG tablet Take 0.5-1 tablets (0.25-0.5 mg total) by mouth 2 (two) times daily as needed for anxiety. 07/02/18   [name], Elige Radon, MD  cetirizine (ZYRTEC) 10 MG tablet Take 1 tablet (10 mg total) by mouth daily. Patient taking differently: Take 10 mg by mouth daily as needed.  04/10/17   Jannifer Rodney A, FNP  ciprofloxacin (CIPRO) 500 MG tablet Take 1 tablet (500 mg total) by mouth 2 (two) times daily. For 7 days 09/20/18   Lajean Manes, MD  cyclobenzaprine (FLEXERIL) 10 MG tablet Take 1 tablet (10 mg total) by mouth 2 (two) times daily as needed for muscle spasms. 07/02/18   [name], Elige Radon, MD  FLUoxetine (PROZAC) 20 MG capsule Take 1 capsule (20 mg total) by mouth daily. 07/02/18   [name], Elige Radon, MD  fluticasone (FLONASE) 50 MCG/ACT nasal spray Place 2 sprays into both nostrils daily. Patient taking differently: Place 2 sprays into both nostrils daily as needed.  04/10/17   Jannifer Rodney A, FNP  losartan-hydrochlorothiazide (HYZAAR) 100-25 MG tablet Take 1 tablet by mouth daily. 07/02/18   [name], Elige Radon, MD    Family History Family History  Problem Relation Age of Onset  . Hypertension Mother   . Early death Father   . Hypertension Brother   . Diabetes Brother   . Hypertension Maternal Grandmother   . Hypertension Paternal Grandmother   . Heart disease Paternal Grandfather   . Early death Paternal Grandfather     Social History Social History   Tobacco Use  . Smoking status: Current Every Day Smoker    Packs/day: 0.50    Years: 18.00    Pack years: 9.00    Types: Cigarettes    Last attempt to quit: 04/11/2015    Years since quitting: 3.4  . Smokeless tobacco: Never Used  Substance Use Topics  . Alcohol use: Yes    Comment: occasional  . Drug use: No   Smokes a half a pack a day.  Does not drink alcohol to  excess.  Denies using drugs.  Allergies   Sulfa antibiotics; Clindamycin/lincomycin; and Latex   Review of Systems Review of Systems  Genitourinary: Positive for flank pain.  All other systems reviewed and are negative.  Pertinent items noted in HPI and remainder of comprehensive ROS otherwise negative.   Physical Exam Triage Vital Signs ED Triage Vitals  Enc Vitals Group     BP 09/20/18 0953 116/79     Pulse Rate 09/20/18 0953 96     Resp --      Temp 09/20/18 0953 98.3 F (36.8 C)     Temp Source 09/20/18 0953 Oral     SpO2 09/20/18 0953 97 %     Weight 09/20/18 0954 262 lb (118.8 kg)     Height --      Head Circumference --      Peak Flow --      Pain Score 09/20/18 0954 4     Pain Loc --      Pain Edu? --      Excl. in GC? --    No data found.  Updated Vital Signs BP 116/79 (BP Location: Right Arm)   Pulse 96   Temp 98.3 F (36.8 C) (Oral)   Wt 118.8 kg   SpO2 97%   BMI 47.92 kg/m   Physical Exam Vitals signs and nursing note reviewed.  Constitutional:      General: She is not in acute distress. Eyes:     General: No scleral icterus. Neck:     Musculoskeletal: Neck supple.  No adenopathy Cardiovascular:     Rate and Rhythm: Normal rate and regular rhythm.     Heart sounds: Normal heart sounds.  Pulmonary:     Effort: No respiratory distress.     Breath sounds: Normal breath sounds.    Abdomen:    Soft.  Bowel sounds normoactive x4.   Tender to palpation left CVA, left flank and left mid lateral abdomen.  No organomegaly or masses.  No right or left lower quadrant tenderness.  No suprapubic tenderness.  No masses guarding or rebound. Lymphadenopathy:     Cervical: No cervical adenopathy.  Skin:    General: Skin is warm and dry.  No rash Neurological:     Mental Status: She is alert and oriented to person, place, and time.  Cranial nerves intact.    UC Treatments / Results  Labs (all labs ordered are listed, but only abnormal results are  displayed) Labs Reviewed  URINE CULTURE  POCT URINALYSIS DIP (MANUAL ENTRY)   UA today is cloudy, with pus visibly observed, but negative for nitrates, leukocytes, blood  or protein or glucose.   EKG None  Radiology No results found.  Procedures Procedures (including critical care time)  Medications Ordered in UC Medications  cefTRIAXone (ROCEPHIN) injection 1,000 mg (1,000 mg Intramuscular Given 09/20/18 1038)    Initial Impression / Assessment and Plan / UC Course  I have reviewed the triage vital signs and the nursing notes.  Pertinent labs & imaging results that were available during my care of the patient were reviewed by me and considered in my medical decision making (see chart for details).      Final Clinical Impressions(s) / UC Diagnoses   Final diagnoses:  Dysuria  Pyelonephritis   Diagnosis likely is mild left pyelonephritis/UTI ureteritis, but I am also suspicious possible kidney stone, although her pain level is 4 out of 10 and she declines any acute pain medicines here in urgent care. After risk benefits alternatives discussed, she agrees with the following plans, see details and discharge instructions below. AVS printed and verbal instructions given at length. Rocephin 1 g IM stat Urine culture Cipro 500 twice daily Other symptomatic care.  Close follow-up instructed with PCP within 2 to 3 days.  If any red flags or severe symptoms, go to emergency room. She declined any prescription for pain medication, and she prefers to use Tylenol as needed.   Discharge Instructions     Diagnosis likely is urinary tract infection, possibly infection into left kidney which is called pyelonephritis. Also possible is left kidney stone, so you need to use the urine strainer and if we can obtain a kidney stone, will send for analysis. Treatment: We are sending a urine culture.  Rocephin (antibiotic) shot today.  Prescription for Cipro-antibiotic sent to your pharmacy.-I  decided not to prescribe Flomax for urinary flow, as there are possible cross reactions between Flomax and Septra which you are allergic to. May use Tylenol for pain.  Drink plenty of fluids. Please read attached instruction/information sheets on pyelonephritis and renal colic/kidney stone. It is important you follow-up with your PCP within 1 week for recheck and to recheck a urinalysis. If you have severe or worsening symptoms, seek medical care or go to emergency room immediately.    ED Prescriptions    Medication Sig Dispense Auth. Provider   ciprofloxacin (CIPRO) 500 MG tablet Take 1 tablet (500 mg total) by mouth 2 (two) times daily. For 7 days 14 tablet Lajean Manes, MD     Controlled Substance Prescriptions [place] Controlled Substance Registry consulted? Not Applicable   Lajean Manes, MD 09/20/18 1110

## 2018-09-20 NOTE — ED Triage Notes (Signed)
Pt c/o flank pain x2 days and hematuria this am. Denies dysuria.

## 2018-09-21 LAB — URINE CULTURE
MICRO NUMBER:: 91501941
SPECIMEN QUALITY:: ADEQUATE

## 2018-09-22 ENCOUNTER — Telehealth: Payer: Self-pay

## 2018-09-22 NOTE — Telephone Encounter (Signed)
Spoke with patient, gave results of UCX.  Pt stated that she was feeling dizzy this morning, and said she would follow up if needed.

## 2018-10-18 ENCOUNTER — Other Ambulatory Visit: Payer: Self-pay | Admitting: Family Medicine

## 2018-10-26 ENCOUNTER — Other Ambulatory Visit: Payer: Self-pay | Admitting: Family Medicine

## 2018-11-30 ENCOUNTER — Ambulatory Visit (INDEPENDENT_AMBULATORY_CARE_PROVIDER_SITE_OTHER): Payer: Commercial Managed Care - PPO | Admitting: Family Medicine

## 2018-11-30 ENCOUNTER — Encounter: Payer: Self-pay | Admitting: Family Medicine

## 2018-11-30 VITALS — BP 141/91 | HR 93 | Temp 97.6°F | Ht 62.0 in | Wt 267.0 lb

## 2018-11-30 DIAGNOSIS — J0110 Acute frontal sinusitis, unspecified: Secondary | ICD-10-CM | POA: Diagnosis not present

## 2018-11-30 DIAGNOSIS — Z8742 Personal history of other diseases of the female genital tract: Secondary | ICD-10-CM | POA: Diagnosis not present

## 2018-11-30 MED ORDER — FLUTICASONE PROPIONATE 50 MCG/ACT NA SUSP: 2.0000 | Freq: Every day | NASAL | 6 refills | Status: DC

## 2018-11-30 MED ORDER — AMOXICILLIN-POT CLAVULANATE 875-125 MG PO TABS: 1.0000 | ORAL_TABLET | Freq: Two times a day (BID) | ORAL | 0 refills | Status: DC

## 2018-11-30 MED ORDER — FLUCONAZOLE 150 MG PO TABS: 150.0000 mg | ORAL_TABLET | Freq: Once | ORAL | 0 refills | Status: AC

## 2018-11-30 NOTE — Patient Instructions (Signed)

## 2018-11-30 NOTE — Progress Notes (Signed)
    Subjective:     Kaitlin Evans is a 48 y.o. female who presents for evaluation of sinus pain. Symptoms include: congestion, cough, headaches, nasal congestion, post nasal drip, sinus pressure and sore throat. Onset of symptoms was 2 weeks ago. Symptoms have been gradually worsening since that time. Past history is significant for no history of pneumonia or bronchitis. Patient is a smoker  (0.5 ppd x 18 yrs).  The following portions of the patient's history were reviewed and updated as appropriate: allergies, current medications, past family history, past medical history, past social history, past surgical history and problem list.  Review of Systems Constitutional: positive for chills, fatigue, fevers and malaise Eyes: negative Ears, nose, mouth, throat, and face: positive for nasal congestion, sore throat and sinus pressure Respiratory: positive for cough and sputum Cardiovascular: negative Musculoskeletal:negative Neurological: positive for headaches   Objective:    BP (!) 141/91   Pulse 93   Temp 97.6 F (36.4 C)   Ht 5\' 2"  (1.575 m)   Wt 267 lb (121.1 kg)   BMI 48.83 kg/m  General appearance: alert, cooperative and mild distress Head: Normocephalic, without obvious abnormality, atraumatic Eyes: negative Ears: abnormal TM right ear - serous middle ear fluid and abnormal TM left ear - serous middle ear fluid Nose: scant and yellow discharge, moderate congestion, turbinates pink, swollen, mild maxillary sinus tenderness bilateral, moderate frontal sinus tenderness bilateral Throat: abnormal findings: mild oropharyngeal erythema Lungs: clear to auscultation bilaterally Heart: regular rate and rhythm, S1, S2 normal, no murmur, click, rub or gallop Skin: Skin color, texture, turgor normal. No rashes or lesions Neurologic: Grossly normal    Assessment:   [name] was seen today for cough.  Diagnoses and all orders for this visit:  Acute non-recurrent frontal  sinusitis Symptomatic care discussed. Due to ongoing symptoms for 14 days will treat with Augmentin. Medications as prescribed. Report any new or worsening symptoms.  -     amoxicillin-clavulanate (AUGMENTIN) 875-125 MG tablet; Take 1 tablet by mouth 2 (two) times daily. -     fluticasone (FLONASE) 50 MCG/ACT nasal spray; Place 2 sprays into both nostrils daily.  History of vulvovaginitis -     fluconazole (DIFLUCAN) 150 MG tablet; Take 1 tablet (150 mg total) by mouth once for 1 dose.     Plan:    Nasal saline sprays. Neti pot recommended. Instructions given. Nasal steroids per medication orders. Augmentin per medication orders.   Return if symptoms worsen or fail to improve.  The above assessment and management plan was discussed with the patient. The patient verbalized understanding of and has agreed to the management plan. Patient is aware to call the clinic if symptoms fail to improve or worsen. Patient is aware when to return to the clinic for a follow-up visit. Patient educated on when it is appropriate to go to the emergency department.   Kari Baars, FNP-C [facility] [address] [place], [place] [address] [phone]

## 2018-12-01 ENCOUNTER — Other Ambulatory Visit: Payer: Self-pay | Admitting: Family Medicine

## 2018-12-01 ENCOUNTER — Telehealth: Payer: Self-pay | Admitting: Family Medicine

## 2018-12-01 DIAGNOSIS — J0110 Acute frontal sinusitis, unspecified: Secondary | ICD-10-CM

## 2018-12-01 MED ORDER — AMOXICILLIN-POT CLAVULANATE 875-125 MG PO TABS: 1.0000 | ORAL_TABLET | Freq: Two times a day (BID) | ORAL | 0 refills | Status: DC

## 2018-12-01 NOTE — Telephone Encounter (Signed)
Patient was seen yesterday by Marcelino Duster and prescribed Augmentin.  Only two tablets were sent to the pharmacy.  Please advise.

## 2018-12-01 NOTE — Telephone Encounter (Signed)
Patient aware that correct prescription has been sent in 

## 2018-12-01 NOTE — Telephone Encounter (Signed)
Patient aware that correct prescription has been sent in

## 2018-12-01 NOTE — Telephone Encounter (Signed)
I sent in the requested prescription 

## 2018-12-28 ENCOUNTER — Other Ambulatory Visit: Payer: Self-pay

## 2018-12-28 ENCOUNTER — Telehealth (INDEPENDENT_AMBULATORY_CARE_PROVIDER_SITE_OTHER): Payer: Commercial Managed Care - PPO | Admitting: Nurse Practitioner

## 2018-12-28 DIAGNOSIS — J0100 Acute maxillary sinusitis, unspecified: Secondary | ICD-10-CM

## 2018-12-28 MED ORDER — DOXYCYCLINE HYCLATE 100 MG PO TABS: 100.0000 mg | ORAL_TABLET | Freq: Two times a day (BID) | ORAL | 0 refills | Status: DC

## 2018-12-28 NOTE — Progress Notes (Signed)
Virtual Visit via telephone Note  I connected with Kaitlin Evans on 12/28/18 at 4:05 PM by telephone and verified that I am speaking with the correct person using two identifiers. Kaitlin Evans is currently located at home and no one is currently with her during visit. The provider, Mary-Margaret Daphine Deutscher, FNP is located in their office at time of visit.  I discussed the limitations, risks, security and privacy concerns of performing an evaluation and management service by telephone and the availability of in person appointments. I also discussed with the patient that there may be a patient responsible charge related to this service. The patient expressed understanding and agreed to proceed.   History and Present Illness:   Chief Complaint: sinusitis  HPI' Patient calls in stating that she has been fighting her allergies for over 2 weeks with OTC meds. In the last 6 days she has developed sinus pressure, with severe post nasal drip. Her left ear started hurting 2 days ago and sore throat started 3 days ago. She has had a low grade fever for 2 days, around 99.9. she has not taken any OTC meds except allergy meds  Review of Systems - ENT ROS: positive for - headaches, nasal congestion, nasal discharge and sinus pain    Observations/Objective: Alert and oriented- answers all questions appropriately Voice sounds froggy Occasional cough during interview  Assessment and Plan: Kaitlin Evans in today with chief complaint of sinus congestion  1. Acute non-recurrent maxillary sinusitis 1. Take meds as prescribed 2. Use a cool mist humidifier especially during the winter months and when heat has been humid. 3. Use saline nose sprays frequently 4. Saline irrigations of the nose can be very helpful if done frequently.  * 4X daily for 1 week*  * Use of a nettie pot can be helpful with this. Follow directions with this* 5. Drink plenty of fluids 6. Keep thermostat turn down low 7.For  any cough or congestion  Use plain Mucinex- regular strength or max strength is fine   * Children- consult with Pharmacist for dosing 8. For fever or aces or pains- take tylenol or ibuprofen appropriate for age and weight.  * for fevers greater than 101 orally you may alternate ibuprofen and tylenol every  3 hours.   Meds ordered this encounter  Medications  . doxycycline (VIBRA-TABS) 100 MG tablet    Sig: Take 1 tablet (100 mg total) by mouth 2 (two) times daily. 1 po bid    Dispense:  20 tablet    Refill:  0    Order Specific Question:   Supervising Provider    Answer:   Arville Care A [[id number]]      Follow Up Instructions:  prn   I discussed the assessment and treatment plan with the patient. The patient was provided an opportunity to ask questions and all were answered. The patient agreed with the plan and demonstrated an understanding of the instructions.   The patient was advised to call back or seek an in-person evaluation if the symptoms worsen or if the condition fails to improve as anticipated.  The above assessment and management plan was discussed with the patient. The patient verbalized understanding of and has agreed to the management plan. Patient is aware to call the clinic if symptoms persist or worsen. Patient is aware when to return to the clinic for a follow-up visit. Patient educated on when it is appropriate to go to the emergency department.    I provided 10 minutes of  non-face-to-face time during this encounter.    Mary-Margaret Hassell Done, FNP

## 2019-01-18 ENCOUNTER — Other Ambulatory Visit: Payer: Self-pay | Admitting: Family

## 2019-01-18 DIAGNOSIS — J0110 Acute frontal sinusitis, unspecified: Secondary | ICD-10-CM

## 2019-01-27 ENCOUNTER — Other Ambulatory Visit: Payer: Self-pay | Admitting: Family Medicine

## 2019-02-08 ENCOUNTER — Other Ambulatory Visit: Payer: Self-pay | Admitting: Family Medicine

## 2019-02-08 DIAGNOSIS — F419 Anxiety disorder, unspecified: Secondary | ICD-10-CM

## 2019-02-11 ENCOUNTER — Other Ambulatory Visit: Payer: Self-pay

## 2019-02-11 ENCOUNTER — Ambulatory Visit (INDEPENDENT_AMBULATORY_CARE_PROVIDER_SITE_OTHER): Payer: Commercial Managed Care - PPO | Admitting: Family Medicine

## 2019-02-11 ENCOUNTER — Encounter: Payer: Self-pay | Admitting: Family Medicine

## 2019-02-11 VITALS — BP 102/80 | HR 85 | Temp 97.1°F | Ht 62.0 in | Wt 263.8 lb

## 2019-02-11 DIAGNOSIS — I10 Essential (primary) hypertension: Secondary | ICD-10-CM

## 2019-02-11 DIAGNOSIS — F320 Major depressive disorder, single episode, mild: Secondary | ICD-10-CM

## 2019-02-11 DIAGNOSIS — Z13220 Encounter for screening for lipoid disorders: Secondary | ICD-10-CM

## 2019-02-11 DIAGNOSIS — R7303 Prediabetes: Secondary | ICD-10-CM

## 2019-02-11 DIAGNOSIS — F419 Anxiety disorder, unspecified: Secondary | ICD-10-CM | POA: Diagnosis not present

## 2019-02-11 LAB — BAYER DCA HB A1C WAIVED: HB A1C (BAYER DCA - WAIVED): 5 % (ref <7.0)

## 2019-02-11 MED ORDER — HYDROCHLOROTHIAZIDE 25 MG PO TABS: 25.0000 mg | ORAL_TABLET | Freq: Every day | ORAL | 3 refills | Status: DC

## 2019-02-11 MED ORDER — CYCLOBENZAPRINE HCL 10 MG PO TABS: 10.0000 mg | ORAL_TABLET | Freq: Two times a day (BID) | ORAL | 2 refills | Status: DC | PRN

## 2019-02-11 MED ORDER — ALPRAZOLAM 0.5 MG PO TABS: 0.2500 mg | ORAL_TABLET | Freq: Two times a day (BID) | ORAL | 2 refills | Status: DC | PRN

## 2019-02-11 MED ORDER — LOSARTAN POTASSIUM 100 MG PO TABS: 100.0000 mg | ORAL_TABLET | Freq: Every day | ORAL | 3 refills | Status: DC

## 2019-02-11 MED ORDER — FLUOXETINE HCL 20 MG PO CAPS: 20.0000 mg | ORAL_CAPSULE | Freq: Every day | ORAL | 3 refills | Status: DC

## 2019-02-11 NOTE — Progress Notes (Signed)
BP 102/80   Pulse 85   Temp (!) 97.1 F (36.2 C) (Oral)   Ht '5\' 2"'  (1.575 m)   Wt 263 lb 12.8 oz (119.7 kg)   BMI 48.25 kg/m    Subjective:   Patient ID: Kaitlin Evans, female    DOB: Nov 29, 1970, 48 y.o.   MRN: [id number]  HPI: Kaitlin Evans is a 48 y.o. female presenting on 02/11/2019 for Anxiety and Hypertension   HPI Prediabetes Patient comes in today for recheck of his diabetes. Patient has been currently taking no medication currently and her last A1c was 6.0 about 5 months ago and will recheck today.  She feels like she is not been eating as well and has been doing as well and probably has gained weight.. Patient is currently on an ACE inhibitor/ARB. Patient has not seen an ophthalmologist this year. Patient denies any issues with their feet.   Hypertension Patient is currently on losartan and hydrochlorothiazide, and their blood pressure today is 2/80. Patient denies any lightheadedness or dizziness. Patient denies headaches, blurred vision, chest pains, shortness of breath, or weakness. Denies any side effects from medication and is content with current medication.   Anxiety and depression recheck Is coming in for anxiety and depression recheck as well.  She has been on the Prozac and Xanax and feels like it is doing very well for her.  She denies any major suicidal ideations or thoughts of hurting self.  She has been doing okay even with the coronavirus  Relevant past medical, surgical, family and social history reviewed and updated as indicated. Interim medical history since our last visit reviewed. Allergies and medications reviewed and updated.  Review of Systems  Constitutional: Negative for chills and fever.  Eyes: Negative for redness and visual disturbance.  Respiratory: Negative for chest tightness and shortness of breath.   Cardiovascular: Negative for chest pain and leg swelling.  Musculoskeletal: Negative for back pain and gait problem.  Skin: Negative  for rash.  Neurological: Negative for light-headedness and headaches.  Psychiatric/Behavioral: Negative for agitation, behavioral problems, decreased concentration, dysphoric mood, self-injury, sleep disturbance and suicidal ideas. The patient is nervous/anxious.   All other systems reviewed and are negative.   Per HPI unless specifically indicated above   Allergies as of 02/11/2019      Reactions   Sulfa Antibiotics Nausea And Vomiting   Nausea with severe vomiting   Clindamycin/lincomycin Rash   Latex Rash      Medication List       Accurate as of Feb 11, 2019  4:02 PM. If you have any questions, ask your nurse or doctor.        STOP taking these medications   amoxicillin-clavulanate 875-125 MG tablet Commonly known as:  AUGMENTIN Stopped by:  Fransisca Kaufmann Zaine Elsass, MD   doxycycline 100 MG tablet Commonly known as:  VIBRA-TABS Stopped by:  Fransisca Kaufmann Chaquana Nichols, MD     TAKE these medications   ALPRAZolam 0.5 MG tablet Commonly known as:  Xanax Take 0.5-1 tablets (0.25-0.5 mg total) by mouth 2 (two) times daily as needed for anxiety.   cetirizine 10 MG tablet Commonly known as:  ZYRTEC Take 1 tablet (10 mg total) by mouth daily. What changed:    when to take this  reasons to take this   cyclobenzaprine 10 MG tablet Commonly known as:  FLEXERIL Take 1 tablet (10 mg total) by mouth 2 (two) times daily as needed for muscle spasms.   FLUoxetine 20 MG capsule Commonly  known as:  PROZAC Take 1 capsule (20 mg total) by mouth daily.   fluticasone 50 MCG/ACT nasal spray Commonly known as:  FLONASE SPRAY 2 SPRAYS INTO EACH NOSTRIL EVERY DAY   hydrochlorothiazide 25 MG tablet Commonly known as:  HYDRODIURIL TAKE 1 TABLET BY MOUTH EVERY DAY   losartan 100 MG tablet Commonly known as:  COZAAR Take 1 tablet (100 mg total) by mouth daily. (Needs to be seen before next refill)        Objective:   BP 102/80   Pulse 85   Temp (!) 97.1 F (36.2 C) (Oral)   Ht '5\' 2"'   (1.575 m)   Wt 263 lb 12.8 oz (119.7 kg)   BMI 48.25 kg/m   Wt Readings from Last 3 Encounters:  02/11/19 263 lb 12.8 oz (119.7 kg)  11/30/18 267 lb (121.1 kg)  09/20/18 262 lb (118.8 kg)    Physical Exam Vitals signs and nursing note reviewed.  Constitutional:      General: She is not in acute distress.    Appearance: She is well-developed. She is not diaphoretic.  Eyes:     Conjunctiva/sclera: Conjunctivae normal.  Cardiovascular:     Rate and Rhythm: Normal rate and regular rhythm.     Heart sounds: Normal heart sounds. No murmur.  Pulmonary:     Effort: Pulmonary effort is normal. No respiratory distress.     Breath sounds: Normal breath sounds. No wheezing.  Skin:    General: Skin is warm and dry.     Findings: No rash.  Neurological:     Mental Status: She is alert and oriented to person, place, and time.     Coordination: Coordination normal.  Psychiatric:        Mood and Affect: Mood is anxious. Mood is not depressed.        Behavior: Behavior normal.        Thought Content: Thought content does not include suicidal ideation. Thought content does not include suicidal plan.       Assessment & Plan:   Problem List Items Addressed This Visit      Cardiovascular and Mediastinum   Hypertension - Primary   Relevant Medications   hydrochlorothiazide (HYDRODIURIL) 25 MG tablet   losartan (COZAAR) 100 MG tablet   Other Relevant Orders   CBC with Differential/Platelet (Completed)   CMP14+EGFR (Completed)     Other   Anxiety   Relevant Medications   ALPRAZolam (XANAX) 0.5 MG tablet   FLUoxetine (PROZAC) 20 MG capsule   Other Relevant Orders   CBC with Differential/Platelet (Completed)   Depression, major, single episode, mild (HCC)   Relevant Medications   ALPRAZolam (XANAX) 0.5 MG tablet   FLUoxetine (PROZAC) 20 MG capsule    Other Visit Diagnoses    Lipid screening       Relevant Orders   Lipase (Completed)   Prediabetes       Relevant Orders   Bayer  DCA Hb A1c Waived (Completed)      Continue current medication will check blood work, no changes Follow up plan: Return in about 3 months (around 05/14/2019), or if symptoms worsen or fail to improve, for Anxiety and hypertension.  Counseling provided for all of the vaccine components No orders of the defined types were placed in this encounter.   Caryl Pina, MD [facility] 02/11/2019, 4:02 PM

## 2019-02-12 LAB — CBC WITH DIFFERENTIAL/PLATELET
Basophils Absolute: 0.1 10*3/uL (ref 0.0–0.2)
Basos: 1 %
EOS (ABSOLUTE): 0.2 10*3/uL (ref 0.0–0.4)
Eos: 2 %
Hematocrit: 45.3 % (ref 34.0–46.6)
Hemoglobin: 15 g/dL (ref 11.1–15.9)
Immature Grans (Abs): 0 10*3/uL (ref 0.0–0.1)
Immature Granulocytes: 0 %
Lymphocytes Absolute: 4 10*3/uL — ABNORMAL HIGH (ref 0.7–3.1)
Lymphs: 41 %
MCH: 29.5 pg (ref 26.6–33.0)
MCHC: 33.1 g/dL (ref 31.5–35.7)
MCV: 89 fL (ref 79–97)
Monocytes Absolute: 0.7 10*3/uL (ref 0.1–0.9)
Monocytes: 7 %
Neutrophils Absolute: 4.8 10*3/uL (ref 1.4–7.0)
Neutrophils: 49 %
Platelets: 365 10*3/uL (ref 150–450)
RBC: 5.08 x10E6/uL (ref 3.77–5.28)
RDW: 13.5 % (ref 11.7–15.4)
WBC: 9.7 10*3/uL (ref 3.4–10.8)

## 2019-02-12 LAB — CMP14+EGFR
ALT: 20 IU/L (ref 0–32)
AST: 12 IU/L (ref 0–40)
Albumin/Globulin Ratio: 1.9 (ref 1.2–2.2)
Albumin: 4.3 g/dL (ref 3.8–4.8)
Alkaline Phosphatase: 113 IU/L (ref 39–117)
BUN/Creatinine Ratio: 14 (ref 9–23)
BUN: 10 mg/dL (ref 6–24)
Bilirubin Total: 0.3 mg/dL (ref 0.0–1.2)
CO2: 22 mmol/L (ref 20–29)
Calcium: 10 mg/dL (ref 8.7–10.2)
Chloride: 98 mmol/L (ref 96–106)
Creatinine, Ser: 0.73 mg/dL (ref 0.57–1.00)
GFR calc Af Amer: 115 mL/min/{1.73_m2} (ref >59)
GFR calc non Af Amer: 100 mL/min/{1.73_m2} (ref >59)
Globulin, Total: 2.3 g/dL (ref 1.5–4.5)
Glucose: 92 mg/dL (ref 65–99)
Potassium: 3.5 mmol/L (ref 3.5–5.2)
Sodium: 137 mmol/L (ref 134–144)
Total Protein: 6.6 g/dL (ref 6.0–8.5)

## 2019-02-12 LAB — LIPASE: Lipase: 28 U/L (ref 14–72)

## 2019-02-28 ENCOUNTER — Other Ambulatory Visit: Payer: Self-pay | Admitting: Family Medicine

## 2019-03-01 NOTE — Telephone Encounter (Signed)
Pt was just seen 02/11/19 and rx was sent to [facility] since pt states she no longer uses [facility]. Pt doesn't need to follow up till around August 8th and she says she will call back to schedule.

## 2019-03-01 NOTE — Telephone Encounter (Signed)
[name]. NTBS 30 days given 5/8/202

## 2019-03-07 ENCOUNTER — Other Ambulatory Visit: Payer: Self-pay | Admitting: Family Medicine

## 2019-03-07 DIAGNOSIS — F419 Anxiety disorder, unspecified: Secondary | ICD-10-CM

## 2019-03-10 ENCOUNTER — Other Ambulatory Visit: Payer: Self-pay | Admitting: Family Medicine

## 2019-04-27 ENCOUNTER — Other Ambulatory Visit: Payer: Self-pay

## 2019-04-29 ENCOUNTER — Ambulatory Visit: Payer: Commercial Managed Care - PPO | Admitting: Family Medicine

## 2019-05-04 ENCOUNTER — Ambulatory Visit: Payer: Commercial Managed Care - PPO | Admitting: Family Medicine

## 2019-05-22 ENCOUNTER — Emergency Department (INDEPENDENT_AMBULATORY_CARE_PROVIDER_SITE_OTHER)
Admission: EM | Admit: 2019-05-22 | Discharge: 2019-05-22 | Disposition: A | Discharge status: Home / Self Care | Payer: Commercial Managed Care - PPO | Source: Home / Self Care | Attending: Family Medicine | Admitting: Family Medicine

## 2019-05-22 ENCOUNTER — Other Ambulatory Visit: Payer: Self-pay

## 2019-05-22 DIAGNOSIS — L089 Local infection of the skin and subcutaneous tissue, unspecified: Secondary | ICD-10-CM

## 2019-05-22 DIAGNOSIS — L723 Sebaceous cyst: Secondary | ICD-10-CM

## 2019-05-22 MED ORDER — DOXYCYCLINE HYCLATE 100 MG PO CAPS: 100.0000 mg | ORAL_CAPSULE | Freq: Two times a day (BID) | ORAL | 0 refills | Status: DC

## 2019-05-22 NOTE — ED Provider Notes (Signed)
[facility]    CSN: [id number] Arrival date & time: 05/22/19  1248     History   Chief Complaint Chief Complaint  Patient presents with  . Recurrent Skin Infections    HPI Kaitlin Evans is a 48 y.o. female.   Patient developed a "bump" on her left hip about 2 months ago.  During the past several days the lesion has become enlarged, erythematous and painful.  The history is provided by the patient.  Abscess Location:  Torso Torso abscess location: left hip. Size:  1.5cm Abscess quality: fluctuance, painful, redness and warmth   Abscess quality: not draining, no itching and not weeping   Red streaking: no   Duration:  2 months Progression:  Worsening Pain details:    Quality:  Aching   Severity:  Mild   Duration:  2 days   Timing:  Constant   Progression:  Worsening Chronicity:  New Context: not skin injury   Relieved by:  None tried Exacerbated by: contact. Ineffective treatments:  None tried Associated symptoms: no fatigue and no fever     Past Medical History:  Diagnosis Date  . Anxiety   . Arthritis   . Depression   . Hypertension   . Sleep apnea     Patient Active Problem List   Diagnosis Date Noted  . Depression, major, single episode, mild (HCC) 07/02/2018  . Morbid obesity (HCC) 12/31/2017  . Hypertension 07/27/2017  . Low back pain 07/27/2017  . Anxiety 07/27/2017    Past Surgical History:  Procedure Laterality Date  . ABLATION  2009  . CESAREAN SECTION  2005  . CESAREAN SECTION  2008  . D & C  2004  . SALIVARY GLAND SURGERY     stones, had it removed    OB History   No obstetric history on file.      Home Medications    Prior to Admission medications   Medication Sig Start Date End Date Taking? Authorizing Provider  ALPRAZolam Prudy Feeler(XANAX) 0.5 MG tablet Take 0.5-1 tablets (0.25-0.5 mg total) by mouth 2 (two) times daily as needed for anxiety. 02/11/19   [name], Elige RadonJoshua A, MD  cetirizine (ZYRTEC) 10 MG tablet Take 1  tablet (10 mg total) by mouth daily. Patient taking differently: Take 10 mg by mouth daily as needed.  04/10/17   Junie SpencerHawks, Christy A, FNP  cyclobenzaprine (FLEXERIL) 10 MG tablet Take 1 tablet (10 mg total) by mouth 2 (two) times daily as needed for muscle spasms. 02/11/19   [name], Elige RadonJoshua A, MD  doxycycline (VIBRAMYCIN) 100 MG capsule Take 1 capsule (100 mg total) by mouth 2 (two) times daily. Take with food. 05/22/19   Lattie HawBeese, Lakoda Raske A, MD  FLUoxetine (PROZAC) 20 MG capsule Take 1 capsule (20 mg total) by mouth daily. 02/11/19   [name], Elige RadonJoshua A, MD  fluticasone (FLONASE) 50 MCG/ACT nasal spray SPRAY 2 SPRAYS INTO EACH NOSTRIL EVERY DAY 01/18/19   Sonny Mastersakes, Linda M, FNP  hydrochlorothiazide (HYDRODIURIL) 25 MG tablet Take 1 tablet (25 mg total) by mouth daily. 02/11/19   [name], Elige RadonJoshua A, MD  losartan (COZAAR) 100 MG tablet Take 1 tablet (100 mg total) by mouth daily. (Needs to be seen before next refill) 02/11/19   [name], Elige RadonJoshua A, MD    Family History Family History  Problem Relation Age of Onset  . Hypertension Mother   . Early death Father   . Hypertension Brother   . Diabetes Brother   . Hypertension Maternal Grandmother   .  Hypertension Paternal Grandmother   . Heart disease Paternal Grandfather   . Early death Paternal Grandfather     Social History Social History   Tobacco Use  . Smoking status: Current Every Day Smoker    Packs/day: 0.50    Years: 18.00    Pack years: 9.00    Types: Cigarettes    Last attempt to quit: 04/11/2015    Years since quitting: 4.1  . Smokeless tobacco: Never Used  Substance Use Topics  . Alcohol use: Yes    Comment: occasional  . Drug use: No     Allergies   Sulfa antibiotics, Clindamycin/lincomycin, and Latex   Review of Systems Review of Systems  Constitutional: Negative for fatigue and fever.  All other systems reviewed and are negative.    Physical Exam Triage Vital Signs ED Triage Vitals  Enc Vitals Group     BP 05/22/19  1304 (!) 141/94     Pulse Rate 05/22/19 1304 69     Resp 05/22/19 1304 18     Temp 05/22/19 1304 97.8 F (36.6 C)     Temp Source 05/22/19 1304 Oral     SpO2 05/22/19 1304 98 %     Weight 05/22/19 1306 262 lb 5.6 oz (119 kg)     Height 05/22/19 1306 5\' 2"  (1.575 m)     Head Circumference --      Peak Flow --      Pain Score 05/22/19 1305 0     Pain Loc --      Pain Edu? --      Excl. in [place]? --    No data found.  Updated Vital Signs BP (!) 141/94 (BP Location: Right Arm)   Pulse 69   Temp 97.8 F (36.6 C) (Oral)   Resp 18   Ht 5\' 2"  (1.575 m)   Wt 119 kg   SpO2 98%   BMI 47.98 kg/m   Visual Acuity Right Eye Distance:   Left Eye Distance:   Bilateral Distance:    Right Eye Near:   Left Eye Near:    Bilateral Near:     Physical Exam Vitals signs and nursing note reviewed.  Constitutional:      General: She is not in acute distress.    Appearance: She is obese.  HENT:     Head: Normocephalic.  Eyes:     Pupils: Pupils are equal, round, and reactive to light.  Cardiovascular:     Rate and Rhythm: Normal rate.  Pulmonary:     Effort: Pulmonary effort is normal.  Abdominal:       Comments: 1.5cm diameter fluctuant erythematous sebaceous cyst above left iliac crest.  Skin:    General: Skin is warm and dry.  Neurological:     Mental Status: She is alert.      UC Treatments / Results  Labs (all labs ordered are listed, but only abnormal results are displayed) Labs Reviewed  WOUND CULTURE    EKG   Radiology No results found.  Procedures Procedures   Incise and drain cyst/abscess Risks and benefits of procedure explained to patient and verbal consent obtained.  Using sterile technique, applied topical refrigerant spray followed by local anesthesia with 1% lidocaine with epinephrine, and cleansed affected area with Betadine and alcohol. Identified the most fluctuant area of lesion and incised with #11 blade.  Expressed sebaceous and purulent  material.  Inserted Iodoform gauze packing.  Bandage applied.  Patient tolerated well   Medications Ordered  in UC Medications - No data to display  Initial Impression / Assessment and Plan / UC Course  I have reviewed the triage vital signs and the nursing notes.  Pertinent labs & imaging results that were available during my care of the patient were reviewed by me and considered in my medical decision making (see chart for details).    Wound culture pending.  Begin doxycycline.  Return tomorrow for follow-up, packing removal, and dressing change.   Final Clinical Impressions(s) / UC Diagnoses   Final diagnoses:  Infected sebaceous cyst     Discharge Instructions     Leave bandage in place until follow-up visit tomorrow.  Keep bandage clean and dry.  May take Ibuprofen or Tylenol if needed for pain.    ED Prescriptions    Medication Sig Dispense Auth. Provider   doxycycline (VIBRAMYCIN) 100 MG capsule Take 1 capsule (100 mg total) by mouth 2 (two) times daily. Take with food. 14 capsule Lattie HawBeese, Marirose Deveney A, MD        Lattie HawBeese, Aissa Lisowski A, MD 05/24/19 [phone]

## 2019-05-22 NOTE — ED Triage Notes (Signed)
Pt c/o boil on LT hip area. Says its hot to touch and red. Denies oozing. About size of a large grape.

## 2019-05-22 NOTE — Discharge Instructions (Addendum)
Leave bandage in place until follow-up visit tomorrow.  Keep bandage clean and dry.  May take Ibuprofen or Tylenol if needed for pain.

## 2019-05-23 ENCOUNTER — Other Ambulatory Visit: Payer: Self-pay

## 2019-05-23 ENCOUNTER — Emergency Department (INDEPENDENT_AMBULATORY_CARE_PROVIDER_SITE_OTHER)
Admission: EM | Admit: 2019-05-23 | Discharge: 2019-05-23 | Disposition: A | Discharge status: Home / Self Care | Payer: Commercial Managed Care - PPO | Source: Home / Self Care

## 2019-05-23 DIAGNOSIS — Z5189 Encounter for other specified aftercare: Secondary | ICD-10-CM

## 2019-05-23 NOTE — Discharge Instructions (Signed)
°  Keep wound clean with warm water and mild soap. Pat dry. You may apply an over the counter antibiotic ointment 2-3 times daily for up to 5 days along with a bandage to encourage the area to continue draining.  You may also apply a warm damp washcloth 2-3 times daily for 15-20 minutes a day to help with draining.  Please take antibiotics as prescribed and be sure to complete entire course even if you start to feel better to ensure infection does not come back.  Please follow up in 3-4 days if the area is not continuing to drain, follow up sooner if symptoms worsening.

## 2019-05-23 NOTE — ED Triage Notes (Signed)
Here for wound follow up.  No drainage through dressing placed yesterday.

## 2019-05-23 NOTE — ED Provider Notes (Addendum)
[facility]    CSN: [id number] Arrival date & time: 05/23/19  0946     History   Chief Complaint Chief Complaint  Patient presents with  . Wound Check    HPI Kaitlin Evans is a 48 y.o. female.   HPI Kaitlin Evans is a 48 y.o. female presenting to UC for packing removal/bandage change due to having an infected cyst I&D yesterday at this urgent care.  She has not filled her prescription of doxycycline yet but states she had some leftover from a prior issue so she did take that last night. Denies fever, chills, n/v/d.  Pain is minimal, she reports a pulling sensation with certain movements.  Past Medical History:  Diagnosis Date  . Anxiety   . Arthritis   . Depression   . Hypertension   . Sleep apnea     Patient Active Problem List   Diagnosis Date Noted  . Depression, major, single episode, mild (HCC) 07/02/2018  . Morbid obesity (HCC) 12/31/2017  . Hypertension 07/27/2017  . Low back pain 07/27/2017  . Anxiety 07/27/2017    Past Surgical History:  Procedure Laterality Date  . ABLATION  2009  . CESAREAN SECTION  2005  . CESAREAN SECTION  2008  . D & C  2004  . SALIVARY GLAND SURGERY     stones, had it removed    OB History   No obstetric history on file.      Home Medications    Prior to Admission medications   Medication Sig Start Date End Date Taking? Authorizing Provider  ALPRAZolam Prudy Feeler(XANAX) 0.5 MG tablet Take 0.5-1 tablets (0.25-0.5 mg total) by mouth 2 (two) times daily as needed for anxiety. 02/11/19   [name], Elige RadonJoshua A, MD  cetirizine (ZYRTEC) 10 MG tablet Take 1 tablet (10 mg total) by mouth daily. Patient taking differently: Take 10 mg by mouth daily as needed.  04/10/17   Junie SpencerHawks, Christy A, FNP  cyclobenzaprine (FLEXERIL) 10 MG tablet Take 1 tablet (10 mg total) by mouth 2 (two) times daily as needed for muscle spasms. 02/11/19   [name], Elige RadonJoshua A, MD  doxycycline (VIBRAMYCIN) 100 MG capsule Take 1 capsule (100 mg total) by mouth 2  (two) times daily. Take with food. 05/22/19   Lattie HawBeese, Stephen A, MD  FLUoxetine (PROZAC) 20 MG capsule Take 1 capsule (20 mg total) by mouth daily. 02/11/19   [name], Elige RadonJoshua A, MD  fluticasone (FLONASE) 50 MCG/ACT nasal spray SPRAY 2 SPRAYS INTO EACH NOSTRIL EVERY DAY 01/18/19   Sonny Mastersakes, Linda M, FNP  hydrochlorothiazide (HYDRODIURIL) 25 MG tablet Take 1 tablet (25 mg total) by mouth daily. 02/11/19   [name], Elige RadonJoshua A, MD  losartan (COZAAR) 100 MG tablet Take 1 tablet (100 mg total) by mouth daily. (Needs to be seen before next refill) 02/11/19   [name], Elige RadonJoshua A, MD    Family History Family History  Problem Relation Age of Onset  . Hypertension Mother   . Early death Father   . Hypertension Brother   . Diabetes Brother   . Hypertension Maternal Grandmother   . Hypertension Paternal Grandmother   . Heart disease Paternal Grandfather   . Early death Paternal Grandfather     Social History Social History   Tobacco Use  . Smoking status: Current Every Day Smoker    Packs/day: 0.50    Years: 18.00    Pack years: 9.00    Types: Cigarettes    Last attempt to quit: 04/11/2015    Years since  quitting: 4.1  . Smokeless tobacco: Never Used  Substance Use Topics  . Alcohol use: Yes    Comment: occasional  . Drug use: No     Allergies   Sulfa antibiotics, Clindamycin/lincomycin, and Latex   Review of Systems Review of Systems  Constitutional: Negative for chills and fever.  Skin: Positive for wound. Negative for color change.     Physical Exam Triage Vital Signs ED Triage Vitals  Enc Vitals Group     BP 05/23/19 1006 123/82     Pulse Rate 05/23/19 1006 82     Resp 05/23/19 1006 20     Temp 05/23/19 1006 97.9 F (36.6 C)     Temp Source 05/23/19 1006 Oral     SpO2 05/23/19 1006 99 %     Weight 05/23/19 1007 257 lb (116.6 kg)     Height 05/23/19 1007 5\' 2"  (1.575 m)     Head Circumference --      Peak Flow --      Pain Score 05/23/19 1006 1     Pain Loc --      Pain  Edu? --      Excl. in [place]? --    No data found.  Updated Vital Signs BP 123/82 (BP Location: Right Arm)   Pulse 82   Temp 97.9 F (36.6 C) (Oral)   Resp 20   Ht 5\' 2"  (1.575 m)   Wt 257 lb (116.6 kg)   SpO2 99%   BMI 47.01 kg/m   Visual Acuity Right Eye Distance:   Left Eye Distance:   Bilateral Distance:    Right Eye Near:   Left Eye Near:    Bilateral Near:     Physical Exam Vitals signs and nursing note reviewed.  Constitutional:      Appearance: Normal appearance. She is well-developed.  HENT:     Head: Normocephalic and atraumatic.  Neck:     Musculoskeletal: Normal range of motion.  Cardiovascular:     Rate and Rhythm: Normal rate.  Pulmonary:     Effort: Pulmonary effort is normal.  Musculoskeletal: Normal range of motion.  Skin:    General: Skin is warm and dry.     Findings: Erythema present.       Neurological:     Mental Status: She is alert and oriented to person, place, and time.  Psychiatric:        Behavior: Behavior normal.      UC Treatments / Results  Labs (all labs ordered are listed, but only abnormal results are displayed) Labs Reviewed - No data to display  EKG   Radiology No results found.  Procedures Procedures (including critical care time)  Medications Ordered in UC Medications - No data to display  Initial Impression / Assessment and Plan / UC Course  I have reviewed the triage vital signs and the nursing notes.  Pertinent labs & imaging results that were available during my care of the patient were reviewed by me and considered in my medical decision making (see chart for details).     Packing removed, wound flushed with saline. Bacitracin and new bandage applied. Stressed importance of completing entire course of antibiotics AVS provided.  NO CHARGE. Pt had I&D performed at this facility on 05/22/2019. Visit within 10 day global period.    Final Clinical Impressions(s) / UC Diagnoses   Final diagnoses:   Wound check, abscess     Discharge Instructions      Keep wound  clean with warm water and mild soap. Pat dry. You may apply an over the counter antibiotic ointment 2-3 times daily for up to 5 days along with a bandage to encourage the area to continue draining.  You may also apply a warm damp washcloth 2-3 times daily for 15-20 minutes a day to help with draining.  Please take antibiotics as prescribed and be sure to complete entire course even if you start to feel better to ensure infection does not come back.  Please follow up in 3-4 days if the area is not continuing to drain, follow up sooner if symptoms worsening.     ED Prescriptions    None     Controlled Substance Prescriptions [place] Controlled Substance Registry consulted? Not Applicable   Tyrell Antonio 05/23/19 1028    [address], [place] 05/23/19 1029

## 2019-05-25 ENCOUNTER — Telehealth: Payer: Self-pay | Admitting: Emergency Medicine

## 2019-05-25 LAB — WOUND CULTURE
MICRO NUMBER:: 777972
SPECIMEN QUALITY:: ADEQUATE

## 2019-05-25 NOTE — Telephone Encounter (Signed)
Place is still hard and oozing, told her to see how it looks tomorrow, finish meds.

## 2019-05-27 ENCOUNTER — Other Ambulatory Visit: Payer: Self-pay

## 2019-05-27 ENCOUNTER — Encounter: Payer: Self-pay | Admitting: Family Medicine

## 2019-05-27 ENCOUNTER — Ambulatory Visit (INDEPENDENT_AMBULATORY_CARE_PROVIDER_SITE_OTHER): Payer: Commercial Managed Care - PPO | Admitting: Family Medicine

## 2019-05-27 VITALS — BP 123/83 | HR 102 | Temp 97.4°F | Ht 62.0 in | Wt 261.0 lb

## 2019-05-27 DIAGNOSIS — L723 Sebaceous cyst: Secondary | ICD-10-CM | POA: Diagnosis not present

## 2019-05-27 MED ORDER — FLUCONAZOLE 150 MG PO TABS: 150.0000 mg | ORAL_TABLET | Freq: Once | ORAL | 0 refills | Status: AC

## 2019-05-27 MED ORDER — AMOXICILLIN-POT CLAVULANATE 875-125 MG PO TABS: 1.0000 | ORAL_TABLET | Freq: Two times a day (BID) | ORAL | 0 refills | Status: DC

## 2019-05-27 NOTE — Progress Notes (Signed)
Chief Complaint  Patient presents with  . Cyst    HPI  Patient presents today for  Concern that there is still some of the cyst remaining after removal several days ago bbecause there was some purulent exudate today.   PMH: Smoking status noted ROS: Per HPI  Objective: BP 123/83   Pulse (!) 102   Temp (!) 97.4 F (36.3 C) (Temporal)   Ht 5\' 2"  (1.575 m)   Wt 261 lb (118.4 kg)   BMI 47.74 kg/m  Gen: NAD, alert, cooperative with exam HEENT: NCAT, Resp: CTABL, no wheezes, non-labored Abd: SNTND, BS present, no guarding or organomegaly. The wound site shows fresh 1 cm incision, beginning to close by secondary intention. No edema or erythema. Ext: No edema, warm Neuro: Alert and oriented, No gross deficits Wound explored at left flank, just superior to iliac crest. I cm incision was opened. Probed with pick ups, scissors used to free sq tissue. No sebaceous material was identified. Exploration to break up loculations did not reveal any purulent matter. Woundcressed with neosporin and bandaid. Assessment and plan:  1. Sebaceous cyst     Meds ordered this encounter  Medications  . amoxicillin-clavulanate (AUGMENTIN) 875-125 MG tablet    Sig: Take 1 tablet by mouth 2 (two) times daily. Take all of this medication    Dispense:  14 tablet    Refill:  0  . fluconazole (DIFLUCAN) 150 MG tablet    Sig: Take 1 tablet (150 mg total) by mouth once for 1 dose. At onset of symptoms. Repeat at end of treatment    Dispense:  2 tablet    Refill:  0    No orders of the defined types were placed in this encounter.   Follow up as needed.  Claretta Fraise, MD

## 2019-06-02 ENCOUNTER — Encounter: Payer: Self-pay | Admitting: Family Medicine

## 2019-08-12 ENCOUNTER — Other Ambulatory Visit: Payer: Self-pay | Admitting: Family Medicine

## 2019-08-12 DIAGNOSIS — F419 Anxiety disorder, unspecified: Secondary | ICD-10-CM

## 2019-08-12 NOTE — Telephone Encounter (Signed)
Apt scheduled.  

## 2019-08-12 NOTE — Telephone Encounter (Signed)
Patient needs an appt to be seen 

## 2019-08-18 ENCOUNTER — Ambulatory Visit (INDEPENDENT_AMBULATORY_CARE_PROVIDER_SITE_OTHER): Payer: Commercial Managed Care - PPO | Admitting: Family Medicine

## 2019-08-18 ENCOUNTER — Encounter: Payer: Self-pay | Admitting: Family Medicine

## 2019-08-18 DIAGNOSIS — I10 Essential (primary) hypertension: Secondary | ICD-10-CM

## 2019-08-18 DIAGNOSIS — F419 Anxiety disorder, unspecified: Secondary | ICD-10-CM

## 2019-08-18 DIAGNOSIS — F320 Major depressive disorder, single episode, mild: Secondary | ICD-10-CM

## 2019-08-18 MED ORDER — ALPRAZOLAM 0.5 MG PO TABS: 0.2500 mg | ORAL_TABLET | Freq: Two times a day (BID) | ORAL | 2 refills | Status: DC | PRN

## 2019-08-18 MED ORDER — FLUOXETINE HCL 20 MG PO CAPS: 20.0000 mg | ORAL_CAPSULE | Freq: Every day | ORAL | 3 refills | Status: DC

## 2019-08-18 NOTE — Progress Notes (Signed)
Virtual Visit via telephone Note  I connected with Kaitlin Evans on 08/18/19 at 0810 by telephone and verified that I am speaking with the correct person using two identifiers. Kaitlin Evans is currently located at home and no other people are currently with her during visit. The provider, Fransisca Kaufmann Mervyn Pflaum, MD is located in their office at time of visit.  Call ended at [place]  I discussed the limitations, risks, security and privacy concerns of performing an evaluation and management service by telephone and the availability of in person appointments. I also discussed with the patient that there may be a patient responsible charge related to this service. The patient expressed understanding and agreed to proceed.   History and Present Illness: Hypertension Patient is currently on losartan and hctz, and their blood pressure today is unknown. Patient denies any lightheadedness or dizziness. Patient denies headaches, blurred vision, chest pains, shortness of breath, or weakness. Denies any side effects from medication and is content with current medication.   Depression and anxiety Patient is calling in for anxiety and depression and is taking prozac.  She takes xanax only as needed. She denies any suicidal ideations or thoughts of hurting herself.  Her daughter has been cutting herself and that is causing a lot of stress.  She also left her job.  Current rx- xanax 0.5 as needed # meds rx- 20 Effectiveness of current meds-works well and only uses as needed Adverse reactions form meds-none  Pill count performed-No Last drug screen - n/a ( high risk q65m, moderate risk q78m, low risk yearly ) Urine drug screen today- No, virtual Was the [place] reviewed- yes  If yes were their any concerning findings? - none  No flowsheet data found.   Controlled substance contract signed on: n/a will do at next visit  Depression screen [facility] 2/9 05/27/2019 02/11/2019 07/02/2018 05/22/2018 12/31/2017   Decreased Interest 0 0 0 1 0  Down, Depressed, Hopeless 0 0 0 1 0  PHQ - 2 Score 0 0 0 2 0  Altered sleeping 0 - - 2 -  Tired, decreased energy 0 - - 2 -  Change in appetite 0 - - 2 -  Feeling bad or failure about yourself  0 - - 3 -  Trouble concentrating 0 - - 1 -  Moving slowly or fidgety/restless 0 - - 1 -  Suicidal thoughts 0 - - 0 -  PHQ-9 Score 0 - - 13 -     No diagnosis found.  Outpatient Encounter Medications as of 08/18/2019  Medication Sig  . ALPRAZolam (XANAX) 0.5 MG tablet Take 0.5-1 tablets (0.25-0.5 mg total) by mouth 2 (two) times daily as needed for anxiety.  Marland Kitchen amoxicillin-clavulanate (AUGMENTIN) 875-125 MG tablet Take 1 tablet by mouth 2 (two) times daily. Take all of this medication  . cyclobenzaprine (FLEXERIL) 10 MG tablet Take 1 tablet (10 mg total) by mouth 2 (two) times daily as needed for muscle spasms.  Marland Kitchen doxycycline (VIBRAMYCIN) 100 MG capsule Take 1 capsule (100 mg total) by mouth 2 (two) times daily. Take with food.  Marland Kitchen FLUoxetine (PROZAC) 20 MG capsule Take 1 capsule (20 mg total) by mouth daily.  . hydrochlorothiazide (HYDRODIURIL) 25 MG tablet Take 1 tablet (25 mg total) by mouth daily.  Marland Kitchen losartan (COZAAR) 100 MG tablet Take 1 tablet (100 mg total) by mouth daily. (Needs to be seen before next refill)   No facility-administered encounter medications on file as of 08/18/2019.     Review of  Systems  Constitutional: Negative for chills and fever.  Eyes: Negative for visual disturbance.  Respiratory: Negative for chest tightness and shortness of breath.   Cardiovascular: Negative for chest pain and leg swelling.  Musculoskeletal: Negative for back pain and gait problem.  Skin: Negative for rash.  Neurological: Negative for light-headedness and headaches.  Psychiatric/Behavioral: Positive for decreased concentration and dysphoric mood. Negative for agitation, behavioral problems, self-injury, sleep disturbance and suicidal ideas. The patient is  nervous/anxious.   All other systems reviewed and are negative.   Observations/Objective: Patient sounds comfortable and in no acute distress  Assessment and Plan: Problem List Items Addressed This Visit      Cardiovascular and Mediastinum   Hypertension - Primary     Other   Anxiety   Relevant Medications   ALPRAZolam (XANAX) 0.5 MG tablet   FLUoxetine (PROZAC) 20 MG capsule   Depression, major, single episode, mild (HCC)   Relevant Medications   ALPRAZolam (XANAX) 0.5 MG tablet   FLUoxetine (PROZAC) 20 MG capsule       Follow Up Instructions: Follow up in 6 months for anxiety    I discussed the assessment and treatment plan with the patient. The patient was provided an opportunity to ask questions and all were answered. The patient agreed with the plan and demonstrated an understanding of the instructions.   The patient was advised to call back or seek an in-person evaluation if the symptoms worsen or if the condition fails to improve as anticipated.  The above assessment and management plan was discussed with the patient. The patient verbalized understanding of and has agreed to the management plan. Patient is aware to call the clinic if symptoms persist or worsen. Patient is aware when to return to the clinic for a follow-up visit. Patient educated on when it is appropriate to go to the emergency department.    I provided 17 minutes of non-face-to-face time during this encounter.    Nils Pyle, MD

## 2019-10-05 ENCOUNTER — Ambulatory Visit (INDEPENDENT_AMBULATORY_CARE_PROVIDER_SITE_OTHER): Payer: Self-pay | Admitting: Family Medicine

## 2019-10-05 ENCOUNTER — Telehealth: Payer: Self-pay | Admitting: *Deleted

## 2019-10-05 DIAGNOSIS — B9689 Other specified bacterial agents as the cause of diseases classified elsewhere: Secondary | ICD-10-CM

## 2019-10-05 DIAGNOSIS — J019 Acute sinusitis, unspecified: Secondary | ICD-10-CM

## 2019-10-05 MED ORDER — AZITHROMYCIN 250 MG PO TABS: ORAL_TABLET | ORAL | 0 refills | Status: DC

## 2019-10-05 MED ORDER — BENZONATATE 200 MG PO CAPS: 200.0000 mg | ORAL_CAPSULE | Freq: Two times a day (BID) | ORAL | 0 refills | Status: DC | PRN

## 2019-10-05 NOTE — Telephone Encounter (Signed)
Double book her into my schedule and I will call her during lunch.  As per office policy all new prescriptions require a visit.  If she cannot wait until this afternoon, she could do an e-visit through one of the [facility] providers through [facility].

## 2019-10-05 NOTE — Progress Notes (Signed)
Telephone visit  Subjective: CC: sinusitis PCP: [name], Fransisca Kaufmann, MD [name] is a 48 y.o. female calls for telephone consult today. Patient provides verbal consent for consult held via phone.  Due to COVID-19 pandemic this visit was conducted virtually. This visit type was conducted due to national recommendations for restrictions regarding the COVID-19 Pandemic (e.g. social distancing, sheltering in place) in an effort to limit this patient's exposure and mitigate transmission in our community. All issues noted in this document were discussed and addressed.  A physical exam was not performed with this format.   Location of patient: Home Location of provider: [facility] Others present for call: None  1.  URI Onset of symptoms about 1 week ago.  She describes sinus pain and pressure with subsequent fluid buildup around her eyelids.  She has had yellow streaks with the blood mucus draining from the nose.  She has nasal congestion, cough.  She has been having to sleep upright in a recliner due to difficulty sleeping comfortably breathing wise.  She does not have overt shortness of breath, wheezing.  She is tolerating physical activity without difficulty.  She had a rapid COVID19 test done today that was negative.  She reports history of sinus infections.   She just started Mucinex D.  ROS: Per HPI  Allergies  Allergen Reactions  . Sulfa Antibiotics Nausea And Vomiting    Nausea with severe vomiting  . Clindamycin/Lincomycin Rash  . Latex Rash   Past Medical History:  Diagnosis Date  . Anxiety   . Arthritis   . Depression   . Hypertension   . Sleep apnea     Current Outpatient Medications:  .  ALPRAZolam (XANAX) 0.5 MG tablet, Take 0.5-1 tablets (0.25-0.5 mg total) by mouth 2 (two) times daily as needed for anxiety., Disp: 20 tablet, Rfl: 2 .  cyclobenzaprine (FLEXERIL) 10 MG tablet, Take 1 tablet (10 mg total) by mouth 2 (two) times daily as needed for muscle spasms., Disp:  30 tablet, Rfl: 2 .  FLUoxetine (PROZAC) 20 MG capsule, Take 1 capsule (20 mg total) by mouth daily., Disp: 90 capsule, Rfl: 3 .  hydrochlorothiazide (HYDRODIURIL) 25 MG tablet, Take 1 tablet (25 mg total) by mouth daily., Disp: 90 tablet, Rfl: 3 .  losartan (COZAAR) 100 MG tablet, Take 1 tablet (100 mg total) by mouth daily. (Needs to be seen before next refill), Disp: 90 tablet, Rfl: 3  Assessment/ Plan: 48 y.o. female   1. Acute bacterial sinusitis Empiric treatment with oral antibiotic given progression of symptoms.  Z-Pak sent to pharmacy.  Tessalon Perles prescribed.  We discussed red flag signs and symptoms warranting further evaluation.  She is to monitor her blood pressure closely with Mucinex D but this likely is better than over-the-counter phenylephrine. - azithromycin (ZITHROMAX) 250 MG tablet; Take 2 tablets today, then take 1 tablet daily until gone.  Dispense: 6 tablet; Refill: 0 - benzonatate (TESSALON) 200 MG capsule; Take 1 capsule (200 mg total) by mouth 2 (two) times daily as needed for cough.  Dispense: 20 capsule; Refill: 0   Start time: 12:01pm End time: 12:06pm  Total time spent on patient care (including telephone call/ virtual visit): 10 minutes  [place], [place] [phone]

## 2019-10-05 NOTE — Telephone Encounter (Signed)
Patient has a follow up appointment scheduled. 

## 2019-10-05 NOTE — Patient Instructions (Signed)

## 2019-10-05 NOTE — Telephone Encounter (Signed)
Patient has cough,head congestion, sore throat,yellow mucous but no fever.  She has been taking mucinex for a week.  She is going at ten this morning to have a rapid covid test. She is hoping she can get a z-pk which usually works well when this has happened before.  Our office has no available appointments.  Please advise.

## 2019-11-01 ENCOUNTER — Encounter: Payer: Self-pay | Admitting: Family Medicine

## 2019-11-01 ENCOUNTER — Ambulatory Visit (INDEPENDENT_AMBULATORY_CARE_PROVIDER_SITE_OTHER): Payer: Self-pay | Admitting: Family Medicine

## 2019-11-01 DIAGNOSIS — J029 Acute pharyngitis, unspecified: Secondary | ICD-10-CM

## 2019-11-01 MED ORDER — AMOXICILLIN-POT CLAVULANATE 875-125 MG PO TABS: 1.0000 | ORAL_TABLET | Freq: Two times a day (BID) | ORAL | 0 refills | Status: AC

## 2019-11-01 NOTE — Progress Notes (Signed)
Virtual Visit via Telephone Note  I connected with Kaitlin Evans on 11/01/19 at 10:06 AM by telephone and verified that I am speaking with the correct person using two identifiers. Kaitlin Evans is currently located at work and nobody is currently with her during this visit. The provider, Gwenlyn Fudge, FNP is located in their office at time of visit.  I discussed the limitations, risks, security and privacy concerns of performing an evaluation and management service by telephone and the availability of in person appointments. I also discussed with the patient that there may be a patient responsible charge related to this service. The patient expressed understanding and agreed to proceed.  Subjective: PCP: [name], Elige Radon, MD  Chief Complaint  Patient presents with  . Sore Throat   Patient complains of sore throat with red and white spots on her throat and uvula. Onset of symptoms was 3 days ago, gradually worsening since that time. She is drinking plenty of fluids. Evaluation to date: none. Treatment to date: Tylenol and Ibuprofen. She does not have a history of allergies, asthma, or COPD. She does smoke.    ROS: Per HPI  Current Outpatient Medications:  .  ALPRAZolam (XANAX) 0.5 MG tablet, Take 0.5-1 tablets (0.25-0.5 mg total) by mouth 2 (two) times daily as needed for anxiety., Disp: 20 tablet, Rfl: 2 .  azithromycin (ZITHROMAX) 250 MG tablet, Take 2 tablets today, then take 1 tablet daily until gone., Disp: 6 tablet, Rfl: 0 .  benzonatate (TESSALON) 200 MG capsule, Take 1 capsule (200 mg total) by mouth 2 (two) times daily as needed for cough., Disp: 20 capsule, Rfl: 0 .  cyclobenzaprine (FLEXERIL) 10 MG tablet, Take 1 tablet (10 mg total) by mouth 2 (two) times daily as needed for muscle spasms., Disp: 30 tablet, Rfl: 2 .  FLUoxetine (PROZAC) 20 MG capsule, Take 1 capsule (20 mg total) by mouth daily., Disp: 90 capsule, Rfl: 3 .  hydrochlorothiazide (HYDRODIURIL) 25 MG  tablet, Take 1 tablet (25 mg total) by mouth daily., Disp: 90 tablet, Rfl: 3 .  losartan (COZAAR) 100 MG tablet, Take 1 tablet (100 mg total) by mouth daily. (Needs to be seen before next refill), Disp: 90 tablet, Rfl: 3  Allergies  Allergen Reactions  . Sulfa Antibiotics Nausea And Vomiting    Nausea with severe vomiting  . Clindamycin/Lincomycin Rash  . Latex Rash   Past Medical History:  Diagnosis Date  . Anxiety   . Arthritis   . Depression   . Hypertension   . Sleep apnea     Observations/Objective: A&O  No respiratory distress or wheezing audible over the phone Mood, judgement, and thought processes all WNL  Assessment and Plan: 1. Sore throat - Treating empirically for strep. Encouraged use of hot tea, honey, throat lozenges, chloraseptic spray, Tylenol/Ibuprofen for sore throat. Education provided on strep throat.  - amoxicillin-clavulanate (AUGMENTIN) 875-125 MG tablet; Take 1 tablet by mouth 2 (two) times daily for 10 days.  Dispense: 20 tablet; Refill: 0   Follow Up Instructions:  I discussed the assessment and treatment plan with the patient. The patient was provided an opportunity to ask questions and all were answered. The patient agreed with the plan and demonstrated an understanding of the instructions.   The patient was advised to call back or seek an in-person evaluation if the symptoms worsen or if the condition fails to improve as anticipated.  The above assessment and management plan was discussed with the patient. The patient verbalized  understanding of and has agreed to the management plan. Patient is aware to call the clinic if symptoms persist or worsen. Patient is aware when to return to the clinic for a follow-up visit. Patient educated on when it is appropriate to go to the emergency department.   Time call ended: 10:13 AM  I provided 11 minutes of non-face-to-face time during this encounter.  Hendricks Limes, MSN, APRN, FNP-C [facility] 11/01/19

## 2019-11-01 NOTE — Patient Instructions (Signed)
Strep Throat, Adult Strep throat is an infection of the throat. It is caused by germs (bacteria). Strep throat is common during the cold months of the year. It mostly affects children who are 5-47 years old. However, people of all ages can get it at any time of the year. When strep throat affects the tonsils, it is called tonsillitis. When it affects the back of the throat, it is called pharyngitis. This infection spreads from person to person through coughing, sneezing, or having close contact. What are the causes? This condition is caused by the Streptococcus pyogenes germ. What increases the risk? You are more likely to develop this condition if:  You care for young children. Children are more likely to get strep throat and may spread it to others.  You go to crowded places. Germs can spread easily in such places.  You kiss or touch someone who has strep throat. What are the signs or symptoms? Symptoms of this condition include:  Fever or chills.  Redness, swelling, or pain in the tonsils or throat.  Pain or trouble when swallowing.  White or yellow spots on the tonsils or throat.  Tender glands in the neck and under the jaw.  Bad breath.  Red rash all over the body. This is rare. How is this treated? This condition may be treated with:  Medicines that kill germs (antibiotics).  Medicines that treat pain or fever. These include: ? Ibuprofen or acetaminophen. ? Aspirin, only for patients who are over the age of 18. ? Throat lozenges. ? Throat sprays. Follow these instructions at home: Medicines   Take over-the-counter and prescription medicines only as told by your doctor.  Take your antibiotic medicine as told by your doctor. Do not stop taking the antibiotic even if you start to feel better. Eating and drinking   If you have trouble swallowing, eat soft foods until your throat feels better.  Drink enough fluid to keep your pee (urine) pale yellow.  To help  with pain, you may have: ? Warm fluids, such as soup and tea. ? Cold fluids, such as frozen desserts or popsicles. General instructions  Rinse your mouth (gargle) with a salt-water mixture 3-4 times a day or as needed. To make a salt-water mixture, dissolve -1 tsp (3-6 g) of salt in 1 cup (237 mL) of warm water.  Rest as much as you can.  Stay home from work or school until you have been taking antibiotics for 24 hours.  Avoid smoking or being around people who smoke.  Keep all follow-up visits as told by your doctor. This is important. How is this prevented?   Do not share food, drinking cups, or personal items. They can cause the germs to spread.  Wash your hands well with soap and water. Make sure that all people in your house wash their hands well.  Have family members tested if they have a fever or a sore throat. They may need an antibiotic if they have strep throat. Contact a doctor if:  You have swelling in your neck that keeps getting bigger.  You get a rash, cough, or earache.  You cough up a thick fluid that is green, yellow-brown, or bloody.  You have pain that does not get better with medicine.  Your symptoms get worse instead of getting better.  You have a fever. Get help right away if:  You vomit.  You have a very bad headache.  Your neck hurts or feels stiff.  You have   chest pain or are short of breath.  You have drooling, very bad throat pain, or changes in your voice.  Your neck is swollen, or the skin gets red and tender.  Your mouth is dry, or you are peeing less than normal.  You keep feeling more tired or have trouble waking up.  Your joints are red or painful. Summary  Strep throat is an infection of the throat. It is caused by germs (bacteria).  This infection can spread from person to person through coughing, sneezing, or having close contact.  Take your medicines, including antibiotics, as told by your doctor. Do not stop taking  the antibiotic even if you start to feel better.  To prevent the spread of germs, wash your hands well with soap and water. Have others do the same. Do not share food, drinking cups, or personal items.  Get help right away if you have a bad headache, chest pain, shortness of breath, a stiff or painful neck, or you vomit. This information is not intended to replace advice given to you by your health care provider. Make sure you discuss any questions you have with your health care provider. Document Revised: 12/10/2018 Document Reviewed: 12/10/2018 Elsevier Patient Education  2020 [employer]vier Inc.  

## 2019-11-11 ENCOUNTER — Ambulatory Visit: Payer: Self-pay | Admitting: Family Medicine

## 2019-12-30 ENCOUNTER — Ambulatory Visit: Payer: Self-pay | Attending: Internal Medicine

## 2019-12-30 DIAGNOSIS — Z23 Encounter for immunization: Secondary | ICD-10-CM

## 2019-12-30 NOTE — Progress Notes (Signed)
   Covid-19 Vaccination Clinic  Name:  Shyleigh Daughtry    MRN: [id number] DOB: 09/19/1971  12/30/2019  Ms. Cottier was observed post Covid-19 immunization for 15 minutes without incident. She was provided with Vaccine Information Sheet and instruction to access the V-Safe system.   Ms. Brewbaker was instructed to call 911 with any severe reactions post vaccine: Marland Kitchen Difficulty breathing  . Swelling of face and throat  . A fast heartbeat  . A bad rash all over body  . Dizziness and weakness   Immunizations Administered    Name Date Dose VIS Date Route   Moderna COVID-19 Vaccine 12/30/2019  9:05 AM 0.5 mL 09/06/2019 Intramuscular   Manufacturer: Moderna   Lot: [id number]   NDC: 71062-694-85

## 2020-01-19 ENCOUNTER — Emergency Department (INDEPENDENT_AMBULATORY_CARE_PROVIDER_SITE_OTHER)
Admission: EM | Admit: 2020-01-19 | Discharge: 2020-01-19 | Disposition: A | Discharge status: Home / Self Care | Payer: Self-pay | Source: Home / Self Care | Attending: Family Medicine | Admitting: Family Medicine

## 2020-01-19 ENCOUNTER — Other Ambulatory Visit: Payer: Self-pay

## 2020-01-19 ENCOUNTER — Encounter: Payer: Self-pay | Admitting: Emergency Medicine

## 2020-01-19 ENCOUNTER — Emergency Department (INDEPENDENT_AMBULATORY_CARE_PROVIDER_SITE_OTHER): Payer: Self-pay

## 2020-01-19 DIAGNOSIS — S40022A Contusion of left upper arm, initial encounter: Secondary | ICD-10-CM

## 2020-01-19 DIAGNOSIS — S139XXA Sprain of joints and ligaments of unspecified parts of neck, initial encounter: Secondary | ICD-10-CM

## 2020-01-19 NOTE — Discharge Instructions (Addendum)
Apply ice pack for 20 to 30 minutes, 3 to 4 times daily  Continue until pain and swelling decrease.  May take Ibuprofen 800mg  every 8 hours with food.  May take Flexeril 10mg , once or twice daily.  Wear soft cervical collar until improved.  Begin range of motion and stretching exercises as tolerated.
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
## 2020-02-01 ENCOUNTER — Ambulatory Visit: Payer: Self-pay | Attending: Internal Medicine

## 2020-02-01 DIAGNOSIS — Z23 Encounter for immunization: Secondary | ICD-10-CM

## 2020-02-01 NOTE — Progress Notes (Signed)
   Covid-19 Vaccination Clinic  Name:  Kaitlin Evans    MRN: [id number] DOB: [birth year]  02/01/2020  Ms. [name] was observed post Covid-19 immunization for 15 minutes without incident. She was provided with Vaccine Information Sheet and instruction to access the V-Safe system.   Ms. [name] was instructed to call 911 with any severe reactions post vaccine: Marland Kitchen Difficulty breathing  . Swelling of face and throat  . A fast heartbeat  . A bad rash all over body  . Dizziness and weakness   Immunizations Administered    Name Date Dose VIS Date Route   Moderna COVID-19 Vaccine 02/01/2020  8:59 AM 0.5 mL 09/2019 Intramuscular   Manufacturer: Moderna   Lot: [id number]   NDC: 54492-010-07

## 2020-02-16 ENCOUNTER — Other Ambulatory Visit: Payer: Self-pay | Admitting: Family Medicine

## 2020-02-16 DIAGNOSIS — F419 Anxiety disorder, unspecified: Secondary | ICD-10-CM

## 2020-02-27 ENCOUNTER — Other Ambulatory Visit: Payer: Self-pay | Admitting: Family Medicine

## 2020-02-27 DIAGNOSIS — F419 Anxiety disorder, unspecified: Secondary | ICD-10-CM

## 2020-03-13 ENCOUNTER — Other Ambulatory Visit: Payer: Self-pay | Admitting: Family Medicine

## 2020-03-13 DIAGNOSIS — F419 Anxiety disorder, unspecified: Secondary | ICD-10-CM

## 2020-04-05 ENCOUNTER — Ambulatory Visit (INDEPENDENT_AMBULATORY_CARE_PROVIDER_SITE_OTHER): Payer: 59 | Admitting: Family Medicine

## 2020-04-05 NOTE — Progress Notes (Signed)
Attempted to call and left voicemail. Arville Care, MD [facility] 04/05/2020, 1:28 PM

## 2020-04-06 ENCOUNTER — Ambulatory Visit: Payer: 59 | Admitting: Family Medicine

## 2020-04-06 ENCOUNTER — Encounter: Payer: Self-pay | Admitting: Family Medicine

## 2020-04-06 ENCOUNTER — Telehealth (INDEPENDENT_AMBULATORY_CARE_PROVIDER_SITE_OTHER): Payer: 59 | Admitting: Family Medicine

## 2020-04-06 DIAGNOSIS — F419 Anxiety disorder, unspecified: Secondary | ICD-10-CM

## 2020-04-06 DIAGNOSIS — F320 Major depressive disorder, single episode, mild: Secondary | ICD-10-CM | POA: Diagnosis not present

## 2020-04-06 DIAGNOSIS — I10 Essential (primary) hypertension: Secondary | ICD-10-CM

## 2020-04-06 MED ORDER — HYDROCHLOROTHIAZIDE 25 MG PO TABS: 25.0000 mg | ORAL_TABLET | Freq: Every day | ORAL | 3 refills | Status: DC

## 2020-04-06 MED ORDER — FLUOXETINE HCL 20 MG PO TABS: 30.0000 mg | ORAL_TABLET | Freq: Every day | ORAL | 3 refills | Status: DC

## 2020-04-06 MED ORDER — ALPRAZOLAM 0.5 MG PO TABS: 0.2500 mg | ORAL_TABLET | Freq: Two times a day (BID) | ORAL | 2 refills | Status: DC | PRN

## 2020-04-06 MED ORDER — LOSARTAN POTASSIUM 100 MG PO TABS: 100.0000 mg | ORAL_TABLET | Freq: Every day | ORAL | 3 refills | Status: DC

## 2020-04-06 NOTE — Progress Notes (Signed)
Virtual Visit via Mychart Video Note  I connected with Kaitlin Evans on 04/06/20 at [phone] by telephone and verified that I am speaking with the correct person using two identifiers. Kaitlin Evans is currently located at work and no other people are currently with her during visit. The provider, Elige Radon Stephannie Broner, MD is located in their office at time of visit.  Call ended at 1000  I discussed the limitations, risks, security and privacy concerns of performing an evaluation and management service by telephone and the availability of in person appointments. I also discussed with the patient that there may be a patient responsible charge related to this service. The patient expressed understanding and agreed to proceed.   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 She feels like her prozac is not helping.  She has a lot more stress building up and she has some minor panic attacks.    Hypertension Patient is currently on losartan and hydrochlorothiazide, and their blood pressure today is unknown. Patient denies any lightheadedness or dizziness. Patient denies headaches, blurred vision, chest pains, shortness of breath, or weakness. Denies any side effects from medication and is content with current medication.   No diagnosis found.  Outpatient Encounter Medications as of 04/06/2020  Medication Sig  . ALPRAZolam (XANAX) 0.5 MG tablet Take 0.5-1 tablets (0.25-0.5 mg total) by mouth 2 (two) times daily as needed for anxiety.  . cyclobenzaprine (FLEXERIL) 10 MG tablet Take 1 tablet (10 mg total) by mouth 2 (two) times daily as needed for muscle spasms.  Marland Kitchen FLUoxetine (PROZAC) 20 MG capsule Take 1 capsule (20 mg total) by mouth daily.  . hydrochlorothiazide (HYDRODIURIL) 25 MG tablet Take 1 tablet (25 mg total) by mouth daily.  Marland Kitchen losartan (COZAAR) 100 MG tablet Take 1 tablet (100 mg total) by mouth daily.  (Needs to be seen before next refill)   No facility-administered encounter medications on file as of 04/06/2020.    Review of Systems  Constitutional: Negative for chills and fever.  Eyes: Negative for visual disturbance.  Respiratory: Negative for chest tightness and shortness of breath.   Cardiovascular: Negative for chest pain and leg swelling.  Musculoskeletal: Negative for back pain and gait problem.  Skin: Negative for rash.  Neurological: Negative for dizziness, weakness, light-headedness and headaches.  Psychiatric/Behavioral: Positive for dysphoric mood. Negative for agitation, behavioral problems, self-injury, sleep disturbance and suicidal ideas. The patient is nervous/anxious.   All other systems reviewed and are negative.   Observations/Objective: Patient sounds comfortable and in no acute distress  Assessment and Plan: Problem List Items Addressed This Visit      Cardiovascular and Mediastinum   Hypertension - Primary   Relevant Medications   hydrochlorothiazide (HYDRODIURIL) 25 MG tablet   losartan (COZAAR) 100 MG tablet     Other   Anxiety   Relevant Medications   ALPRAZolam (XANAX) 0.5 MG tablet   FLUoxetine (PROZAC) 20 MG tablet   Depression, major, single episode, mild (HCC)   Relevant Medications   ALPRAZolam (XANAX) 0.5 MG tablet   FLUoxetine (PROZAC) 20 MG tablet      Increased prozac to 30 mg and refill for xanax, return in 1 month.    Follow up plan: Return if symptoms worsen or fail to improve, for 4-8 weeks htn and anxiety.     I discussed the assessment and treatment plan with the patient.  The patient was provided an opportunity to ask questions and all were answered. The patient agreed with the plan and demonstrated an understanding of the instructions.   The patient was advised to call back or seek an in-person evaluation if the symptoms worsen or if the condition fails to improve as anticipated.  The above assessment and management plan  was discussed with the patient. The patient verbalized understanding of and has agreed to the management plan. Patient is aware to call the clinic if symptoms persist or worsen. Patient is aware when to return to the clinic for a follow-up visit. Patient educated on when it is appropriate to go to the emergency department.    I provided 17 minutes of non-face-to-face time during this encounter.    Nils Pyle, MD

## 2020-06-07 ENCOUNTER — Other Ambulatory Visit: Payer: Self-pay

## 2020-06-07 DIAGNOSIS — Z20822 Contact with and (suspected) exposure to covid-19: Secondary | ICD-10-CM

## 2020-06-09 LAB — NOVEL CORONAVIRUS, NAA: SARS-CoV-2, NAA: NOT DETECTED

## 2020-08-22 ENCOUNTER — Other Ambulatory Visit: Payer: Self-pay | Admitting: Family Medicine

## 2020-08-22 DIAGNOSIS — F320 Major depressive disorder, single episode, mild: Secondary | ICD-10-CM

## 2020-08-22 DIAGNOSIS — F419 Anxiety disorder, unspecified: Secondary | ICD-10-CM

## 2020-10-02 ENCOUNTER — Other Ambulatory Visit: Payer: Self-pay | Admitting: Family Medicine

## 2020-10-02 DIAGNOSIS — F320 Major depressive disorder, single episode, mild: Secondary | ICD-10-CM

## 2020-10-02 DIAGNOSIS — F419 Anxiety disorder, unspecified: Secondary | ICD-10-CM

## 2020-11-01 ENCOUNTER — Encounter: Payer: Self-pay | Admitting: Family Medicine

## 2020-11-01 ENCOUNTER — Other Ambulatory Visit: Payer: Self-pay

## 2020-11-01 ENCOUNTER — Ambulatory Visit (INDEPENDENT_AMBULATORY_CARE_PROVIDER_SITE_OTHER): Payer: No Typology Code available for payment source | Admitting: Family Medicine

## 2020-11-01 VITALS — BP 115/79 | HR 88 | Ht 62.0 in | Wt 259.0 lb

## 2020-11-01 DIAGNOSIS — F419 Anxiety disorder, unspecified: Secondary | ICD-10-CM | POA: Diagnosis not present

## 2020-11-01 DIAGNOSIS — Z1211 Encounter for screening for malignant neoplasm of colon: Secondary | ICD-10-CM

## 2020-11-01 DIAGNOSIS — F320 Major depressive disorder, single episode, mild: Secondary | ICD-10-CM

## 2020-11-01 DIAGNOSIS — I10 Essential (primary) hypertension: Secondary | ICD-10-CM

## 2020-11-01 DIAGNOSIS — Z23 Encounter for immunization: Secondary | ICD-10-CM | POA: Diagnosis not present

## 2020-11-01 MED ORDER — LOSARTAN POTASSIUM 100 MG PO TABS: 100.0000 mg | ORAL_TABLET | Freq: Every day | ORAL | 3 refills | Status: DC

## 2020-11-01 MED ORDER — FLUOXETINE HCL 20 MG PO TABS: ORAL_TABLET | ORAL | 3 refills | Status: DC

## 2020-11-01 MED ORDER — HYDROCHLOROTHIAZIDE 25 MG PO TABS: 25.0000 mg | ORAL_TABLET | Freq: Every day | ORAL | 3 refills | Status: DC

## 2020-11-01 NOTE — Progress Notes (Signed)
BP 115/79   Pulse 88   Ht 5' 2" (1.575 m)   Wt 259 lb (117.5 kg)   SpO2 98%   BMI 47.37 kg/m    Subjective:   Patient ID: Kaitlin Evans, female    DOB: Feb 23, 1971, 50 y.o.   MRN: [id number]  HPI: Kaitlin Evans is a 50 y.o. female presenting on 11/01/2020 for Medical Management of Chronic Issues, Anxiety, and Hypertension   HPI Hypertension Patient is currently on losartan and hydrochlorothiazide, and their blood pressure today is 115/79. Patient denies any lightheadedness or dizziness. Patient denies headaches, blurred vision, chest pains, shortness of breath, or weakness. Denies any side effects from medication and is content with current medication.   Depression and anxiety Patient is coming in for depression anxiety recheck.  She does have the alprazolam as needed but says she has not had to use it in quite some time.  She also has Prozac and since we increased it to taking 1-1/2 tablets of the 20 mg tablet she has been doing a lot better and feels like it is working very well for and her anxiety keeps under control and she does not feel like it is overwhelming like it making her feel like a zombie.  Relevant past medical, surgical, family and social history reviewed and updated as indicated. Interim medical history since our last visit reviewed. Allergies and medications reviewed and updated.  Review of Systems  Constitutional: Negative for chills and fever.  HENT: Negative for congestion, ear discharge and ear pain.   Eyes: Negative for redness and visual disturbance.  Respiratory: Negative for chest tightness and shortness of breath.   Cardiovascular: Positive for leg swelling. Negative for chest pain.  Genitourinary: Negative for difficulty urinating and dysuria.  Musculoskeletal: Negative for back pain and gait problem.  Skin: Negative for rash.  Neurological: Negative for light-headedness and headaches.  Psychiatric/Behavioral: Negative for agitation and behavioral  problems.  All other systems reviewed and are negative.   Per HPI unless specifically indicated above   Allergies as of 11/01/2020      Reactions   Sulfa Antibiotics Nausea And Vomiting   Nausea with severe vomiting   Clindamycin/lincomycin Rash   Latex Rash      Medication List       Accurate as of November 01, 2020  8:24 AM. If you have any questions, ask your nurse or doctor.        ALPRAZolam 0.5 MG tablet Commonly known as: Xanax Take 0.5-1 tablets (0.25-0.5 mg total) by mouth 2 (two) times daily as needed for anxiety.   cyclobenzaprine 10 MG tablet Commonly known as: FLEXERIL Take 1 tablet (10 mg total) by mouth 2 (two) times daily as needed for muscle spasms.   FLUoxetine 20 MG tablet Commonly known as: PROZAC TAKE 1 & 1/2 (ONE & ONE-HALF) TABLETS BY MOUTH ONCE DAILY . APPOINTMENT REQUIRED FOR FUTURE REFILLS   hydrochlorothiazide 25 MG tablet Commonly known as: HYDRODIURIL Take 1 tablet (25 mg total) by mouth daily.   losartan 100 MG tablet Commonly known as: COZAAR Take 1 tablet (100 mg total) by mouth daily. (Needs to be seen before next refill)        Objective:   BP 115/79   Pulse 88   Ht 5' 2" (1.575 m)   Wt 259 lb (117.5 kg)   SpO2 98%   BMI 47.37 kg/m   Wt Readings from Last 3 Encounters:  11/01/20 259 lb (117.5 kg)  01/19/20 265 lb (120.2  kg)  05/27/19 261 lb (118.4 kg)    Physical Exam Vitals and nursing note reviewed.  Constitutional:      General: She is not in acute distress.    Appearance: She is well-developed and well-nourished. She is not diaphoretic.  Eyes:     Extraocular Movements: EOM normal.     Conjunctiva/sclera: Conjunctivae normal.     Pupils: Pupils are equal, round, and reactive to light.  Cardiovascular:     Rate and Rhythm: Normal rate and regular rhythm.     Pulses: Intact distal pulses.     Heart sounds: Normal heart sounds. No murmur heard.   Pulmonary:     Effort: Pulmonary effort is normal. No  respiratory distress.     Breath sounds: Normal breath sounds. No wheezing.  Musculoskeletal:        General: Swelling (Trace bilateral lower extremity edema) present. No tenderness or edema. Normal range of motion.  Skin:    General: Skin is warm and dry.     Findings: No rash.  Neurological:     Mental Status: She is alert and oriented to person, place, and time.     Coordination: Coordination normal.  Psychiatric:        Mood and Affect: Mood and affect normal.        Behavior: Behavior normal.       Assessment & Plan:   Problem List Items Addressed This Visit      Cardiovascular and Mediastinum   Hypertension - Primary   Relevant Medications   losartan (COZAAR) 100 MG tablet   hydrochlorothiazide (HYDRODIURIL) 25 MG tablet   Other Relevant Orders   CMP14+EGFR   Lipid panel     Other   Anxiety   Relevant Medications   FLUoxetine (PROZAC) 20 MG tablet   Depression, major, single episode, mild (HCC)   Relevant Medications   FLUoxetine (PROZAC) 20 MG tablet    Other Visit Diagnoses    Essential hypertension       Relevant Medications   losartan (COZAAR) 100 MG tablet   hydrochlorothiazide (HYDRODIURIL) 25 MG tablet   Other Relevant Orders   CBC with Differential/Platelet   CMP14+EGFR   Lipid panel   Need for immunization against influenza       Relevant Orders   Flu Vaccine QUAD 36+ mos IM (Completed)   Need for Tdap vaccination       Relevant Orders   Tdap vaccine greater than or equal to 7yo IM   Colon cancer screening       Relevant Orders   Cologuard      Continue current medication, patient will come back tomorrow to do blood work. Follow up plan: Return in about 6 months (around 05/01/2021), or if symptoms worsen or fail to improve, for Pap smear and recheck hypertension and anxiety.  Counseling provided for all of the vaccine components No orders of the defined types were placed in this encounter.   Joshua [name], MD [facility] 11/01/2020, 8:24 AM     

## 2020-11-27 ENCOUNTER — Encounter: Payer: Self-pay | Admitting: *Deleted

## 2021-01-04 ENCOUNTER — Encounter: Payer: Self-pay | Admitting: Family Medicine

## 2021-01-04 ENCOUNTER — Ambulatory Visit (INDEPENDENT_AMBULATORY_CARE_PROVIDER_SITE_OTHER): Payer: No Typology Code available for payment source | Admitting: Family Medicine

## 2021-01-04 ENCOUNTER — Other Ambulatory Visit: Payer: Self-pay

## 2021-01-04 VITALS — BP 127/84 | HR 82 | Temp 97.8°F | Ht 62.0 in | Wt 261.1 lb

## 2021-01-04 DIAGNOSIS — M545 Low back pain, unspecified: Secondary | ICD-10-CM

## 2021-01-04 LAB — MICROSCOPIC EXAMINATION
Bacteria, UA: NONE SEEN
Epithelial Cells (non renal): NONE SEEN /hpf (ref 0–10)
RBC, Urine: NONE SEEN /hpf (ref 0–2)
WBC, UA: NONE SEEN /hpf (ref 0–5)

## 2021-01-04 LAB — URINALYSIS, COMPLETE
Bilirubin, UA: NEGATIVE
Glucose, UA: NEGATIVE
Ketones, UA: NEGATIVE
Leukocytes,UA: NEGATIVE
Nitrite, UA: NEGATIVE
Protein,UA: NEGATIVE
RBC, UA: NEGATIVE
Specific Gravity, UA: 1.01 (ref 1.005–1.030)
Urobilinogen, Ur: 0.2 mg/dL (ref 0.2–1.0)
pH, UA: 7 (ref 5.0–7.5)

## 2021-01-04 MED ORDER — CYCLOBENZAPRINE HCL 10 MG PO TABS: 10.0000 mg | ORAL_TABLET | Freq: Three times a day (TID) | ORAL | 0 refills | Status: DC | PRN

## 2021-01-04 NOTE — Progress Notes (Signed)
Acute Office Visit  Subjective:    Patient ID: Kaitlin Evans, female    DOB: 15-Mar-1971, 50 y.o.   MRN: [id number]  Chief Complaint  Patient presents with  . Flank Pain    HPI Patient is in today for right flank pain that started this morning. The pain is intermittent and sharp like a stabbing pain. The pain is a 6-7/10 with it comes. She has had frequency and urgency for about 1 day. She denies dysuria, fever, chills, abdominal pain, nausea, or vomiting. She denies injury. She did got to the gym the other night for the first time in awhile and was on the elliptical and treadmill. She has been staying well hydrated. She has not taken anything for the pain today. She has had this pain occasionally for for about a year but not to this extent. She has never had a kidney stone. She does not have frequent UTIs.   Past Medical History:  Diagnosis Date  . Anxiety   . Arthritis   . Depression   . Hypertension   . Sleep apnea     Past Surgical History:  Procedure Laterality Date  . ABLATION  2009  . CESAREAN SECTION  2005  . CESAREAN SECTION  2008  . D & C  2004  . SALIVARY GLAND SURGERY     stones, had it removed    Family History  Problem Relation Age of Onset  . Hypertension Mother   . Early death Father   . Hypertension Brother   . Diabetes Brother   . Hypertension Maternal Grandmother   . Hypertension Paternal Grandmother   . Heart disease Paternal Grandfather   . Early death Paternal Grandfather     Social History   Socioeconomic History  . Marital status: Married    Spouse name: Not on file  . Number of children: Not on file  . Years of education: Not on file  . Highest education level: Not on file  Occupational History  . Not on file  Tobacco Use  . Smoking status: Current Every Day Smoker    Packs/day: 0.50    Years: 18.00    Pack years: 9.00    Types: Cigarettes    Last attempt to quit: 04/11/2015    Years since quitting: 5.7  . Smokeless tobacco:  Never Used  Vaping Use  . Vaping Use: Some days  Substance and Sexual Activity  . Alcohol use: Yes    Comment: occasional  . Drug use: No  . Sexual activity: Yes    Birth control/protection: Surgical    Comment: surgery endometrial ablation 2009  Other Topics Concern  . Not on file  Social History Narrative  . Not on file   Social Determinants of Health   Financial Resource Strain: Not on file  Food Insecurity: Not on file  Transportation Needs: Not on file  Physical Activity: Not on file  Stress: Not on file  Social Connections: Not on file  Intimate Partner Violence: Not on file    Outpatient Medications Prior to Visit  Medication Sig Dispense Refill  . ALPRAZolam (XANAX) 0.5 MG tablet Take 0.5-1 tablets (0.25-0.5 mg total) by mouth 2 (two) times daily as needed for anxiety. 20 tablet 2  . cetirizine (ZYRTEC) 10 MG tablet Take 10 mg by mouth daily.    Marland Kitchen FLUoxetine (PROZAC) 20 MG tablet TAKE 1 & 1/2 (ONE & ONE-HALF) TABLETS BY MOUTH ONCE DAILY . APPOINTMENT REQUIRED FOR FUTURE REFILLS 135 tablet 3  .  hydrochlorothiazide (HYDRODIURIL) 25 MG tablet Take 1 tablet (25 mg total) by mouth daily. 90 tablet 3  . Ibuprofen-diphenhydrAMINE HCl (ADVIL PM) 200-25 MG CAPS Take by mouth.    . losartan (COZAAR) 100 MG tablet Take 1 tablet (100 mg total) by mouth daily. (Needs to be seen before next refill) 90 tablet 3  . cyclobenzaprine (FLEXERIL) 10 MG tablet Take 1 tablet (10 mg total) by mouth 2 (two) times daily as needed for muscle spasms. 30 tablet 2   No facility-administered medications prior to visit.    Allergies  Allergen Reactions  . Sulfa Antibiotics Nausea And Vomiting    Nausea with severe vomiting  . Clindamycin/Lincomycin Rash  . Latex Rash    Review of Systems As per HPI.     Objective:    Physical Exam Vitals and nursing note reviewed.  Constitutional:      General: She is not in acute distress.    Appearance: Normal appearance. She is not ill-appearing,  toxic-appearing or diaphoretic.  HENT:     Head: Normocephalic and atraumatic.  Cardiovascular:     Rate and Rhythm: Normal rate and regular rhythm.     Heart sounds: Normal heart sounds. No murmur heard.   Pulmonary:     Effort: Pulmonary effort is normal.     Breath sounds: Normal breath sounds.  Abdominal:     General: Bowel sounds are normal. There is no distension.     Palpations: Abdomen is soft.     Tenderness: There is no abdominal tenderness. There is no right CVA tenderness, left CVA tenderness, guarding or rebound.  Musculoskeletal:     Lumbar back: Tenderness present. No swelling, edema, deformity, signs of trauma or bony tenderness. Normal range of motion. Negative right straight leg raise test and negative left straight leg raise test.       Back:     Right lower leg: No edema.     Left lower leg: No edema.  Skin:    General: Skin is warm and dry.  Neurological:     General: No focal deficit present.     Mental Status: She is alert and oriented to person, place, and time.  Psychiatric:        Mood and Affect: Mood normal.        Behavior: Behavior normal.     BP 127/84   Pulse 82   Temp 97.8 F (36.6 C) (Temporal)   Ht 5\' 2"  (1.575 m)   Wt 261 lb 2 oz (118.4 kg)   BMI 47.76 kg/m  Wt Readings from Last 3 Encounters:  01/04/21 261 lb 2 oz (118.4 kg)  11/01/20 259 lb (117.5 kg)  01/19/20 265 lb (120.2 kg)   Urine dipstick shows negative for all components.  Micro exam: negative for WBC's or RBC's, negative bacteria.  There are no preventive care reminders to display for this patient.  There are no preventive care reminders to display for this patient.   No results found for: TSH Lab Results  Component Value Date   WBC 9.7 02/11/2019   HGB 15.0 02/11/2019   HCT 45.3 02/11/2019   MCV 89 02/11/2019   PLT 365 02/11/2019   Lab Results  Component Value Date   NA 137 02/11/2019   K 3.5 02/11/2019   CO2 22 02/11/2019   GLUCOSE 92 02/11/2019   BUN  10 02/11/2019   CREATININE 0.73 02/11/2019   BILITOT 0.3 02/11/2019   ALKPHOS 113 02/11/2019   AST 12 02/11/2019  ALT 20 02/11/2019   PROT 6.6 02/11/2019   ALBUMIN 4.3 02/11/2019   CALCIUM 10.0 02/11/2019   Lab Results  Component Value Date   CHOL 178 07/27/2017   Lab Results  Component Value Date   HDL 42 07/27/2017   Lab Results  Component Value Date   LDLCALC 114 (H) 07/27/2017   Lab Results  Component Value Date   TRIG 110 07/27/2017   Lab Results  Component Value Date   CHOLHDL 4.2 07/27/2017   Lab Results  Component Value Date   HGBA1C 5.0 02/11/2019       Assessment & Plan:   Kaitlin Evans was seen today for flank pain.  Diagnoses and all orders for this visit:  Acute right-sided low back pain without sciatica UA negative. Tenderness of right lower paraspinal muscle on exam. Flexeril for possible spasms. Discussed heat, ice, rest, tylenol, advil for pain. Return to office for new or worsening symptoms, or if symptoms persist.  -     Urinalysis, Complete -     cyclobenzaprine (FLEXERIL) 10 MG tablet; Take 1 tablet (10 mg total) by mouth 3 (three) times daily as needed for muscle spasms.  The patient indicates understanding of these issues and agrees with the plan.   Gabriel Earing, FNP

## 2021-01-04 NOTE — Patient Instructions (Signed)
Musculoskeletal Pain Musculoskeletal pain refers to aches and pains in your bones, joints, muscles, and the tissues that surround them. This pain can occur in any part of the body. It can last for a short time (acute) or a long time (chronic). A physical exam, lab tests, and imaging studies may be done to find the cause of your musculoskeletal pain. Follow these instructions at home: Lifestyle  Try to control or lower your stress levels. Stress increases muscle tension and can worsen musculoskeletal pain. It is important to recognize when you are anxious or stressed and learn ways to manage it. This may include: ? Meditation or yoga. ? Cognitive or behavioral therapy. ? Acupuncture or massage therapy.  You may continue all activities unless the activities cause more pain. When the pain gets better, slowly resume your normal activities. Gradually increase the intensity and duration of your activities or exercise. Managing pain, stiffness, and swelling  Treatment may include medicines for pain and inflammation that are taken by mouth or applied to the skin. Take over-the-counter and prescription medicines only as told by your health care provider.  When your pain is severe, bed rest may be helpful. Lie or sit in any position that is comfortable, but get out of bed and walk around at least every couple of hours.  If directed, apply heat to the affected area as often as told by your health care provider. Use the heat source that your health care provider recommends, such as a moist heat pack or a heating pad. ? Place a towel between your skin and the heat source. ? Leave the heat on for 20-30 minutes. ? Remove the heat if your skin turns bright red. This is especially important if you are unable to feel pain, heat, or cold. You may have a greater risk of getting burned.  If directed, put ice on the painful area. To do this: ? Put ice in a plastic bag. ? Place a towel between your skin and the  bag. ? Leave the ice on for 20 minutes, 2-3 times a day. ? Remove the ice if your skin turns bright red. This is very important. If you cannot feel pain, heat, or cold, you have a greater risk of damage to the area.      General instructions  Your health care provider may recommend that you see a physical therapist. This person can help you come up with a safe exercise program.  If told by your health care provider, do physical therapy exercises to improve movement and strength in the affected area.  Keep all follow-up visits. This is important. This includes any physical therapy visits. Contact a health care provider if:  Your pain gets worse.  Medicines do not help ease your pain.  You cannot use the part of your body that hurts, such as your arm, leg, or neck.  You have trouble sleeping.  You have trouble doing your normal activities. Get help right away if:  You have a new injury and your pain is worse or different.  You feel numb or you have tingling in the painful area. Summary  Musculoskeletal pain refers to aches and pains in your bones, joints, muscles, and the tissues that surround them.  This pain can occur in any part of the body.  Your health care provider may recommend that you see a physical therapist. This person can help you come up with a safe exercise program. Do any exercises as told by your physical therapist.    Lower your stress level. Stress can worsen musculoskeletal pain. Ways to lower stress may include meditation, yoga, cognitive or behavioral therapy, acupuncture, and massage therapy. This information is not intended to replace advice given to you by your health care provider. Make sure you discuss any questions you have with your health care provider. Document Revised: 01/26/2020 Document Reviewed: 01/04/2020 Elsevier Patient Education  2021 [employer]  

## 2021-04-10 ENCOUNTER — Other Ambulatory Visit: Payer: Self-pay

## 2021-04-10 ENCOUNTER — Ambulatory Visit: Payer: No Typology Code available for payment source | Admitting: Nurse Practitioner

## 2021-04-10 ENCOUNTER — Encounter: Payer: Self-pay | Admitting: Nurse Practitioner

## 2021-04-10 VITALS — BP 130/86 | HR 91 | Temp 97.4°F | Ht 62.0 in | Wt 265.0 lb

## 2021-04-10 DIAGNOSIS — M79675 Pain in left toe(s): Secondary | ICD-10-CM | POA: Diagnosis not present

## 2021-04-10 MED ORDER — HYDROCORTISONE 0.5 % EX CREA: 1.0000 "application " | TOPICAL_CREAM | Freq: Two times a day (BID) | CUTANEOUS | 0 refills | Status: DC

## 2021-04-10 MED ORDER — CETIRIZINE HCL 10 MG PO TABS: 10.0000 mg | ORAL_TABLET | Freq: Every day | ORAL | 0 refills | Status: DC

## 2021-04-10 NOTE — Assessment & Plan Note (Signed)
Insect bite few days ago.  Patient was in the emergency department was treated with Silvadene.  No therapeutic effect.  On assessment slight blister on medial toe of left foot.  No erythema, mild swelling, and slight blister at tip of toe.  Advised patient to apply cool compress, antihistamine, ibuprofen for pain and follow-up with her symptoms.

## 2021-04-10 NOTE — Progress Notes (Signed)
Acute Office Visit  Subjective:    Patient ID: Kaitlin Evans, female    DOB: 03-16-1971, 50 y.o.   MRN: [id number]  Chief Complaint  Patient presents with   Insect Bite    HPI Patient is in today for Pain  She reports new onset left middle toe pain. was not an injury that may have caused the pain. Patient reports insect bite few days ago.The pain started a few days ago and is staying constant. The pain does not radiate . The pain is described as itching and tender, is moderate in intensity, occurring constantly. Symptoms are worse in the: morning, mid-day, afternoon  Aggravating factors: none Relieving factors: none.  She has tried application of ice with no relief..  Recent ED visit for the same symptoms, patient was treated with Silvadene.  ---------------------------------------------------------------------------------------------------   Past Medical History:  Diagnosis Date   Anxiety    Arthritis    Depression    Hypertension    Sleep apnea     Past Surgical History:  Procedure Laterality Date   ABLATION  2009   CESAREAN SECTION  2005   CESAREAN SECTION  2008   D & C  2004   SALIVARY GLAND SURGERY     stones, had it removed    Family History  Problem Relation Age of Onset   Hypertension Mother    Early death Father    Hypertension Brother    Diabetes Brother    Hypertension Maternal Grandmother    Hypertension Paternal Grandmother    Heart disease Paternal Grandfather    Early death Paternal Grandfather     Social History   Socioeconomic History   Marital status: Married    Spouse name: Not on file   Number of children: Not on file   Years of education: Not on file   Highest education level: Not on file  Occupational History   Not on file  Tobacco Use   Smoking status: Every Day    Packs/day: 0.50    Years: 18.00    Pack years: 9.00    Types: Cigarettes    Last attempt to quit: 04/11/2015    Years since quitting: 6.0   Smokeless tobacco:  Never  Vaping Use   Vaping Use: Some days  Substance and Sexual Activity   Alcohol use: Yes    Comment: occasional   Drug use: No   Sexual activity: Yes    Birth control/protection: Surgical    Comment: surgery endometrial ablation 2009  Other Topics Concern   Not on file  Social History Narrative   Not on file   Social Determinants of Health   Financial Resource Strain: Not on file  Food Insecurity: Not on file  Transportation Needs: Not on file  Physical Activity: Not on file  Stress: Not on file  Social Connections: Not on file  Intimate Partner Violence: Not on file    Outpatient Medications Prior to Visit  Medication Sig Dispense Refill   ALPRAZolam (XANAX) 0.5 MG tablet Take 0.5-1 tablets (0.25-0.5 mg total) by mouth 2 (two) times daily as needed for anxiety. 20 tablet 2   cyclobenzaprine (FLEXERIL) 10 MG tablet Take 1 tablet (10 mg total) by mouth 3 (three) times daily as needed for muscle spasms. 30 tablet 0   FLUoxetine (PROZAC) 20 MG tablet TAKE 1 & 1/2 (ONE & ONE-HALF) TABLETS BY MOUTH ONCE DAILY . APPOINTMENT REQUIRED FOR FUTURE REFILLS 135 tablet 3   hydrochlorothiazide (HYDRODIURIL) 25 MG tablet Take 1  tablet (25 mg total) by mouth daily. 90 tablet 3   Ibuprofen-diphenhydrAMINE HCl (ADVIL PM) 200-25 MG CAPS Take by mouth.     losartan (COZAAR) 100 MG tablet Take 1 tablet (100 mg total) by mouth daily. (Needs to be seen before next refill) 90 tablet 3   cetirizine (ZYRTEC) 10 MG tablet Take 10 mg by mouth daily.     No facility-administered medications prior to visit.    Allergies  Allergen Reactions   Sulfa Antibiotics Nausea And Vomiting    Nausea with severe vomiting   Clindamycin/Lincomycin Rash   Latex Rash    Review of Systems  Constitutional: Negative.   HENT: Negative.    Respiratory: Negative.    Cardiovascular: Negative.   Gastrointestinal: Negative.   Musculoskeletal:  Positive for joint swelling.  Skin:  Negative for rash.  All other  systems reviewed and are negative.     Objective:    Physical Exam Vitals and nursing note reviewed.  Constitutional:      Appearance: Normal appearance.  HENT:     Head: Normocephalic.  Eyes:     Conjunctiva/sclera: Conjunctivae normal.  Pulmonary:     Effort: Pulmonary effort is normal.     Breath sounds: Normal breath sounds.  Abdominal:     General: Bowel sounds are normal.  Musculoskeletal:     Left foot: Swelling and tenderness present.       Legs:     Comments: Very small blister   Neurological:     Mental Status: She is alert and oriented to person, place, and time.  Psychiatric:        Behavior: Behavior normal.    BP 130/86   Pulse 91   Temp (!) 97.4 F (36.3 C) (Temporal)   Ht 5\' 2"  (1.575 m)   Wt 265 lb (120.2 kg)   SpO2 97%   BMI 48.47 kg/m  Wt Readings from Last 3 Encounters:  04/10/21 265 lb (120.2 kg)  01/04/21 261 lb 2 oz (118.4 kg)  11/01/20 259 lb (117.5 kg)    Health Maintenance Due  Topic Date Due   Pneumococcal Vaccine 65-49 Years old (1 - PCV) Never done   HIV Screening  Never done   Hepatitis C Screening  Never done    There are no preventive care reminders to display for this patient.   No results found for: TSH Lab Results  Component Value Date   WBC 9.7 02/11/2019   HGB 15.0 02/11/2019   HCT 45.3 02/11/2019   MCV 89 02/11/2019   PLT 365 02/11/2019   Lab Results  Component Value Date   NA 137 02/11/2019   K 3.5 02/11/2019   CO2 22 02/11/2019   GLUCOSE 92 02/11/2019   BUN 10 02/11/2019   CREATININE 0.73 02/11/2019   BILITOT 0.3 02/11/2019   ALKPHOS 113 02/11/2019   AST 12 02/11/2019   ALT 20 02/11/2019   PROT 6.6 02/11/2019   ALBUMIN 4.3 02/11/2019   CALCIUM 10.0 02/11/2019   Lab Results  Component Value Date   CHOL 178 07/27/2017   Lab Results  Component Value Date   HDL 42 07/27/2017   Lab Results  Component Value Date   LDLCALC 114 (H) 07/27/2017   Lab Results  Component Value Date   TRIG 110  07/27/2017   Lab Results  Component Value Date   CHOLHDL 4.2 07/27/2017   Lab Results  Component Value Date   HGBA1C 5.0 02/11/2019       Assessment &  Plan:   Problem List Items Addressed This Visit       Other   Pain of toe of left foot - Primary    Insect bite few days ago.  Patient was in the emergency department was treated with Silvadene.  No therapeutic effect.  On assessment slight blister on medial toe of left foot.  No erythema, mild swelling, and slight blister at tip of toe.  Advised patient to apply cool compress, antihistamine, ibuprofen for pain and follow-up with her symptoms.       Relevant Medications   cetirizine (ZYRTEC ALLERGY) 10 MG tablet   hydrocortisone cream 0.5 %     Meds ordered this encounter  Medications   cetirizine (ZYRTEC ALLERGY) 10 MG tablet    Sig: Take 1 tablet (10 mg total) by mouth daily.    Dispense:  30 tablet    Refill:  0    Order Specific Question:   Supervising Provider    Answer:   Raliegh Ip [[id number]]   hydrocortisone cream 0.5 %    Sig: Apply 1 application topically 2 (two) times daily.    Dispense:  30 g    Refill:  0    Order Specific Question:   Supervising Provider    Answer:   Raliegh Ip [[id number]]      Daryll Drown, NP

## 2021-05-01 ENCOUNTER — Encounter: Payer: No Typology Code available for payment source | Admitting: Family Medicine

## 2021-05-06 ENCOUNTER — Encounter: Payer: Self-pay | Admitting: Family Medicine

## 2021-05-06 ENCOUNTER — Other Ambulatory Visit: Payer: Self-pay

## 2021-05-06 ENCOUNTER — Other Ambulatory Visit (HOSPITAL_COMMUNITY)
Admission: RE | Admit: 2021-05-06 | Discharge: 2021-05-06 | Disposition: A | Discharge status: Home / Self Care | Payer: 59 | Source: Ambulatory Visit | Attending: Family Medicine | Admitting: Family Medicine

## 2021-05-06 ENCOUNTER — Ambulatory Visit (INDEPENDENT_AMBULATORY_CARE_PROVIDER_SITE_OTHER): Payer: No Typology Code available for payment source | Admitting: Family Medicine

## 2021-05-06 VITALS — BP 138/91 | HR 103 | Ht 62.0 in | Wt 266.0 lb

## 2021-05-06 DIAGNOSIS — F419 Anxiety disorder, unspecified: Secondary | ICD-10-CM | POA: Diagnosis not present

## 2021-05-06 DIAGNOSIS — N841 Polyp of cervix uteri: Secondary | ICD-10-CM | POA: Diagnosis not present

## 2021-05-06 DIAGNOSIS — F320 Major depressive disorder, single episode, mild: Secondary | ICD-10-CM

## 2021-05-06 DIAGNOSIS — Z01411 Encounter for gynecological examination (general) (routine) with abnormal findings: Secondary | ICD-10-CM

## 2021-05-06 DIAGNOSIS — Z01419 Encounter for gynecological examination (general) (routine) without abnormal findings: Secondary | ICD-10-CM

## 2021-05-06 MED ORDER — ALPRAZOLAM 0.5 MG PO TABS: 0.2500 mg | ORAL_TABLET | Freq: Two times a day (BID) | ORAL | 2 refills | Status: DC | PRN

## 2021-05-06 NOTE — Progress Notes (Signed)
BP (!) 138/91   Pulse (!) 103   Ht '5\' 2"'  (1.575 m)   Wt 266 lb (120.7 kg)   SpO2 98%   BMI 48.65 kg/m    Subjective:   Patient ID: Kaitlin Evans, female    DOB: 1971/09/24, 50 y.o.   MRN: [id number]  HPI: Kaitlin Evans is a 50 y.o. female presenting on 05/06/2021 for Medical Management of Chronic Issues (CPE with pap and breast exam)   HPI Well woman exam Patient is coming in for well woman exam and recheck of chronic medical issues.  She currently takes fluoxetine and the occasional alprazolam.  She does not use very frequently but does need a refill on file just in case she needs it. Patient denies any chest pain, shortness of breath, headaches or vision issues, abdominal complaints, diarrhea, nausea, vomiting, or joint issues.  Patient denies any breast or vaginal issues that she knows of but she has not had a mammogram in some time.  Relevant past medical, surgical, family and social history reviewed and updated as indicated. Interim medical history since our last visit reviewed. Allergies and medications reviewed and updated.  Review of Systems  Constitutional:  Negative for chills and fever.  HENT:  Negative for ear pain and tinnitus.   Eyes:  Negative for pain.  Respiratory:  Negative for cough, shortness of breath and wheezing.   Cardiovascular:  Negative for chest pain, palpitations and leg swelling.  Gastrointestinal:  Negative for abdominal pain, blood in stool, constipation and diarrhea.  Genitourinary:  Negative for dysuria, hematuria, vaginal bleeding, vaginal discharge and vaginal pain.  Musculoskeletal:  Negative for back pain and myalgias.  Skin:  Negative for rash.  Neurological:  Negative for dizziness, weakness and headaches.  Psychiatric/Behavioral:  Negative for suicidal ideas.    Per HPI unless specifically indicated above   Allergies as of 05/06/2021       Reactions   Sulfa Antibiotics Nausea And Vomiting   Nausea with severe vomiting    Clindamycin/lincomycin Rash   Latex Rash        Medication List        Accurate as of May 06, 2021  3:35 PM. If you have any questions, ask your nurse or doctor.          Advil PM 200-25 MG Caps Generic drug: Ibuprofen-diphenhydrAMINE HCl Take by mouth.   ALPRAZolam 0.5 MG tablet Commonly known as: Xanax Take 0.5-1 tablets (0.25-0.5 mg total) by mouth 2 (two) times daily as needed for anxiety.   cetirizine 10 MG tablet Commonly known as: ZyrTEC Allergy Take 1 tablet (10 mg total) by mouth daily.   cyclobenzaprine 10 MG tablet Commonly known as: FLEXERIL Take 1 tablet (10 mg total) by mouth 3 (three) times daily as needed for muscle spasms.   FLUoxetine 20 MG tablet Commonly known as: PROZAC TAKE 1 & 1/2 (ONE & ONE-HALF) TABLETS BY MOUTH ONCE DAILY . APPOINTMENT REQUIRED FOR FUTURE REFILLS   hydrochlorothiazide 25 MG tablet Commonly known as: HYDRODIURIL Take 1 tablet (25 mg total) by mouth daily.   hydrocortisone cream 0.5 % Apply 1 application topically 2 (two) times daily.   losartan 100 MG tablet Commonly known as: COZAAR Take 1 tablet (100 mg total) by mouth daily. (Needs to be seen before next refill)         Objective:   BP (!) 138/91   Pulse (!) 103   Ht '5\' 2"'  (1.575 m)   Wt 266 lb (120.7 kg)  SpO2 98%   BMI 48.65 kg/m   Wt Readings from Last 3 Encounters:  05/06/21 266 lb (120.7 kg)  04/10/21 265 lb (120.2 kg)  01/04/21 261 lb 2 oz (118.4 kg)    Physical Exam Vitals reviewed. Exam conducted with a chaperone present.  Constitutional:      General: She is not in acute distress.    Appearance: She is well-developed. She is not diaphoretic.  Eyes:     Conjunctiva/sclera: Conjunctivae normal.     Pupils: Pupils are equal, round, and reactive to light.  Neck:     Thyroid: No thyromegaly.  Cardiovascular:     Rate and Rhythm: Normal rate and regular rhythm.     Heart sounds: Normal heart sounds. No murmur heard. Pulmonary:      Effort: Pulmonary effort is normal. No respiratory distress.     Breath sounds: Normal breath sounds. No wheezing.  Chest:  Breasts:    Breasts are symmetrical.     Right: No inverted nipple, mass, nipple discharge, skin change or tenderness.     Left: No inverted nipple, mass, nipple discharge, skin change or tenderness.  Abdominal:     General: Bowel sounds are normal. There is no distension.     Palpations: Abdomen is soft.     Tenderness: There is no abdominal tenderness. There is no guarding or rebound.  Genitourinary:    Exam position: Lithotomy position.     Labia:        Right: No rash or lesion.        Left: No rash or lesion.      Vagina: Normal.     Cervix: Lesion (Small 0.25 cm cervical polyp on the end of her cervix, nontender, no bleeding) present. No cervical motion tenderness, discharge or friability.     Uterus: Not deviated, not enlarged, not fixed and not tender.      Adnexa:        Right: No mass or tenderness.         Left: No mass or tenderness.    Musculoskeletal:        General: Normal range of motion.     Cervical back: Neck supple.  Lymphadenopathy:     Cervical: No cervical adenopathy.  Skin:    General: Skin is warm and dry.     Findings: No rash.  Neurological:     Mental Status: She is alert and oriented to person, place, and time.     Coordination: Coordination normal.  Psychiatric:        Behavior: Behavior normal.      Assessment & Plan:   Problem List Items Addressed This Visit       Other   Anxiety   Relevant Medications   ALPRAZolam (XANAX) 0.5 MG tablet   Depression, major, single episode, mild (HCC)   Relevant Medications   ALPRAZolam (XANAX) 0.5 MG tablet   Other Visit Diagnoses     Well woman exam with routine gynecological exam    -  Primary   Relevant Orders   Cytology - PAP   CBC with Differential/Platelet   CMP14+EGFR   Lipid panel   Cervical polyp           Cervical polyp looks benign in nature, will redo  exam next year make sure it has not changed. Follow up plan: Return in about 6 months (around 11/06/2021), or if symptoms worsen or fail to improve, for Depression and chronic medicine treatment recheck.  Counseling provided  for all of the vaccine components Orders Placed This Encounter  Procedures   CBC with Differential/Platelet   CMP14+EGFR   Lipid panel    Caryl Pina, MD [facility] 05/06/2021, 3:35 PM

## 2021-05-07 ENCOUNTER — Encounter: Payer: Self-pay | Admitting: Family Medicine

## 2021-05-07 LAB — CBC WITH DIFFERENTIAL/PLATELET
Basophils Absolute: 0 10*3/uL (ref 0.0–0.2)
Basos: 0 %
EOS (ABSOLUTE): 0.3 10*3/uL (ref 0.0–0.4)
Eos: 2 %
Hematocrit: 41.6 % (ref 34.0–46.6)
Hemoglobin: 14 g/dL (ref 11.1–15.9)
Immature Grans (Abs): 0 10*3/uL (ref 0.0–0.1)
Immature Granulocytes: 0 %
Lymphocytes Absolute: 4.6 10*3/uL — ABNORMAL HIGH (ref 0.7–3.1)
Lymphs: 37 %
MCH: 29.7 pg (ref 26.6–33.0)
MCHC: 33.7 g/dL (ref 31.5–35.7)
MCV: 88 fL (ref 79–97)
Monocytes Absolute: 1.1 10*3/uL — ABNORMAL HIGH (ref 0.1–0.9)
Monocytes: 9 %
Neutrophils Absolute: 6.5 10*3/uL (ref 1.4–7.0)
Neutrophils: 52 %
Platelets: 352 10*3/uL (ref 150–450)
RBC: 4.72 x10E6/uL (ref 3.77–5.28)
RDW: 13.2 % (ref 11.7–15.4)
WBC: 12.5 10*3/uL — ABNORMAL HIGH (ref 3.4–10.8)

## 2021-05-07 LAB — LIPID PANEL
Chol/HDL Ratio: 5.1 ratio — ABNORMAL HIGH (ref 0.0–4.4)
Cholesterol, Total: 230 mg/dL — ABNORMAL HIGH (ref 100–199)
HDL: 45 mg/dL (ref >39)
LDL Chol Calc (NIH): 130 mg/dL — ABNORMAL HIGH (ref 0–99)
Triglycerides: 307 mg/dL — ABNORMAL HIGH (ref 0–149)
VLDL Cholesterol Cal: 55 mg/dL — ABNORMAL HIGH (ref 5–40)

## 2021-05-07 LAB — CMP14+EGFR
ALT: 15 IU/L (ref 0–32)
AST: 13 IU/L (ref 0–40)
Albumin/Globulin Ratio: 1.7 (ref 1.2–2.2)
Albumin: 4.3 g/dL (ref 3.8–4.8)
Alkaline Phosphatase: 120 IU/L (ref 44–121)
BUN/Creatinine Ratio: 17 (ref 9–23)
BUN: 11 mg/dL (ref 6–24)
Bilirubin Total: <0.2 mg/dL (ref 0.0–1.2)
CO2: 26 mmol/L (ref 20–29)
Calcium: 9.5 mg/dL (ref 8.7–10.2)
Chloride: 101 mmol/L (ref 96–106)
Creatinine, Ser: 0.66 mg/dL (ref 0.57–1.00)
Globulin, Total: 2.6 g/dL (ref 1.5–4.5)
Glucose: 81 mg/dL (ref 65–99)
Potassium: 4.6 mmol/L (ref 3.5–5.2)
Sodium: 141 mmol/L (ref 134–144)
Total Protein: 6.9 g/dL (ref 6.0–8.5)
eGFR: 109 mL/min/{1.73_m2} (ref >59)

## 2021-05-08 ENCOUNTER — Encounter: Payer: Self-pay | Admitting: Family Medicine

## 2021-05-09 LAB — CYTOLOGY - PAP
Adequacy: ABSENT
Diagnosis: NEGATIVE

## 2021-05-28 ENCOUNTER — Ambulatory Visit (INDEPENDENT_AMBULATORY_CARE_PROVIDER_SITE_OTHER): Payer: No Typology Code available for payment source | Admitting: Nurse Practitioner

## 2021-05-28 ENCOUNTER — Encounter: Payer: Self-pay | Admitting: Nurse Practitioner

## 2021-05-28 DIAGNOSIS — Z20822 Contact with and (suspected) exposure to covid-19: Secondary | ICD-10-CM | POA: Diagnosis not present

## 2021-05-28 NOTE — Progress Notes (Signed)
   Virtual Visit  Note Due to COVID-19 pandemic this visit was conducted virtually. This visit type was conducted due to national recommendations for restrictions regarding the COVID-19 Pandemic (e.g. social distancing, sheltering in place) in an effort to limit this patient's exposure and mitigate transmission in our community. All issues noted in this document were discussed and addressed.  A physical exam was not performed with this format.  I connected with Kaitlin Evans on 05/28/21 at 04:00 pm by telephone and verified that I am speaking with the correct person using two identifiers. Kaitlin Evans is currently located at home during visit. The provider, Daryll Drown, NP is located in their office at time of visit.  I discussed the limitations, risks, security and privacy concerns of performing an evaluation and management service by telephone and the availability of in person appointments. I also discussed with the patient that there may be a patient responsible charge related to this service. The patient expressed understanding and agreed to proceed.   History and Present Illness:  HPI  Patient exposed to COVID-19 in the last 24 hours.  Everyone in the household is sick and positive for COVID.  Patient is experiencing cough, and congestion with mild headache, no fever, loss of taste or chills associated with current symptoms.  Review of Systems  Constitutional: Negative.   HENT: Negative.    Respiratory:  Positive for cough.   Cardiovascular: Negative.   Skin:  Negative for rash.  All other systems reviewed and are negative.   Observations/Objective: Tele- visit, patient not in distress  Assessment and Plan:   Covid-19 swab completed results pending.   Follow Up Instructions: Follow-up with worsening or unresolved symptoms.    I discussed the assessment and treatment plan with the patient. The patient was provided an opportunity to ask questions and all were  answered. The patient agreed with the plan and demonstrated an understanding of the instructions.   The patient was advised to call back or seek an in-person evaluation if the symptoms worsen or if the condition fails to improve as anticipated.  The above assessment and management plan was discussed with the patient. The patient verbalized understanding of and has agreed to the management plan. Patient is aware to call the clinic if symptoms persist or worsen. Patient is aware when to return to the clinic for a follow-up visit. Patient educated on when it is appropriate to go to the emergency department.   Time call ended: 4:08 PM  I provided 8 minutes of  non face-to-face time during this encounter.    Daryll Drown, NP

## 2021-05-28 NOTE — Assessment & Plan Note (Signed)
  Covid-19 swab completed results pending.

## 2021-05-29 LAB — SARS-COV-2, NAA 2 DAY TAT

## 2021-05-29 LAB — NOVEL CORONAVIRUS, NAA: SARS-CoV-2, NAA: NOT DETECTED

## 2021-05-29 NOTE — Telephone Encounter (Signed)
Okay for note

## 2021-05-30 ENCOUNTER — Ambulatory Visit (INDEPENDENT_AMBULATORY_CARE_PROVIDER_SITE_OTHER): Payer: No Typology Code available for payment source | Admitting: Family Medicine

## 2021-05-30 ENCOUNTER — Encounter: Payer: Self-pay | Admitting: Family Medicine

## 2021-05-30 DIAGNOSIS — J0100 Acute maxillary sinusitis, unspecified: Secondary | ICD-10-CM | POA: Diagnosis not present

## 2021-05-30 MED ORDER — FLUCONAZOLE 150 MG PO TABS: 150.0000 mg | ORAL_TABLET | Freq: Once | ORAL | 0 refills | Status: AC

## 2021-05-30 MED ORDER — FLUTICASONE PROPIONATE 50 MCG/ACT NA SUSP: 1.0000 | Freq: Two times a day (BID) | NASAL | 6 refills | Status: DC | PRN

## 2021-05-30 MED ORDER — AMOXICILLIN-POT CLAVULANATE 875-125 MG PO TABS: 1.0000 | ORAL_TABLET | Freq: Two times a day (BID) | ORAL | 0 refills | Status: DC

## 2021-05-30 NOTE — Progress Notes (Signed)
Virtual Visit via telephone Note  I connected with Kaitlin Evans on 05/30/21 at [phone] by telephone and verified that I am speaking with the correct person using two identifiers. Kaitlin Evans is currently located at home and hello this is Dr. Louanne Skye over [facility] is this Kirt Boys are currently with her during visit. The provider, Elige Radon Samariya Rockhold, MD is located in their office at time of visit.  Call ended at [phone]  I discussed the limitations, risks, security and privacy concerns of performing an evaluation and management service by telephone and the availability of in person appointments. I also discussed with the patient that there may be a patient responsible charge related to this service. The patient expressed understanding and agreed to proceed.   History and Present Illness: Patient is calling in for cough and congestion and sinus pressure.  She has done a home test and a test here and it was negative.  She has pressure left side of head and sore throat and coughing and left ear pressure. She is fatigued and exhausted and is taking Advil cold and sinus.  She is not having trouble breathing or wheezing.  She has noted any fever but has had chills. She has a husband who has covid 1.5 weeks ago. She had 2 home tests.   1. Acute non-recurrent maxillary sinusitis     Outpatient Encounter Medications as of 05/30/2021  Medication Sig   amoxicillin-clavulanate (AUGMENTIN) 875-125 MG tablet Take 1 tablet by mouth 2 (two) times daily.   fluconazole (DIFLUCAN) 150 MG tablet Take 1 tablet (150 mg total) by mouth once for 1 dose.   fluticasone (FLONASE) 50 MCG/ACT nasal spray Place 1 spray into both nostrils 2 (two) times daily as needed for allergies or rhinitis.   ALPRAZolam (XANAX) 0.5 MG tablet Take 0.5-1 tablets (0.25-0.5 mg total) by mouth 2 (two) times daily as needed for anxiety.   cetirizine (ZYRTEC ALLERGY) 10 MG tablet Take 1 tablet (10 mg total) by mouth daily.    cyclobenzaprine (FLEXERIL) 10 MG tablet Take 1 tablet (10 mg total) by mouth 3 (three) times daily as needed for muscle spasms.   FLUoxetine (PROZAC) 20 MG tablet TAKE 1 & 1/2 (ONE & ONE-HALF) TABLETS BY MOUTH ONCE DAILY . APPOINTMENT REQUIRED FOR FUTURE REFILLS   hydrochlorothiazide (HYDRODIURIL) 25 MG tablet Take 1 tablet (25 mg total) by mouth daily.   hydrocortisone cream 0.5 % Apply 1 application topically 2 (two) times daily.   Ibuprofen-diphenhydrAMINE HCl (ADVIL PM) 200-25 MG CAPS Take by mouth.   losartan (COZAAR) 100 MG tablet Take 1 tablet (100 mg total) by mouth daily. (Needs to be seen before next refill)   No facility-administered encounter medications on file as of 05/30/2021.    Review of Systems  Constitutional:  Positive for chills. Negative for fever.  HENT:  Positive for congestion, postnasal drip, rhinorrhea, sinus pressure, sneezing and sore throat. Negative for ear discharge and ear pain.   Eyes:  Negative for visual disturbance.  Respiratory:  Positive for cough. Negative for chest tightness and shortness of breath.   Cardiovascular:  Negative for chest pain and leg swelling.  Skin:  Negative for rash.  Neurological:  Negative for light-headedness and headaches.  Psychiatric/Behavioral:  Negative for agitation and behavioral problems.   All other systems reviewed and are negative.  Observations/Objective: Patient sounds comfortable and in no acute distress  Assessment and Plan: Problem List Items Addressed This Visit   None Visit Diagnoses     Acute  non-recurrent maxillary sinusitis    -  Primary   Relevant Medications   fluticasone (FLONASE) 50 MCG/ACT nasal spray   amoxicillin-clavulanate (AUGMENTIN) 875-125 MG tablet   fluconazole (DIFLUCAN) 150 MG tablet       Been fighting covid like symptoms and more than 5 days, will treat with antibiotic. Follow up plan: Return if symptoms worsen or fail to improve.     I discussed the assessment and treatment  plan with the patient. The patient was provided an opportunity to ask questions and all were answered. The patient agreed with the plan and demonstrated an understanding of the instructions.   The patient was advised to call back or seek an in-person evaluation if the symptoms worsen or if the condition fails to improve as anticipated.  The above assessment and management plan was discussed with the patient. The patient verbalized understanding of and has agreed to the management plan. Patient is aware to call the clinic if symptoms persist or worsen. Patient is aware when to return to the clinic for a follow-up visit. Patient educated on when it is appropriate to go to the emergency department.    I provided 14 minutes of non-face-to-face time during this encounter.    Nils Pyle, MD

## 2021-06-20 ENCOUNTER — Encounter: Payer: Self-pay | Admitting: Family Medicine

## 2021-06-20 DIAGNOSIS — M130 Polyarthritis, unspecified: Secondary | ICD-10-CM

## 2021-07-04 ENCOUNTER — Other Ambulatory Visit: Payer: Self-pay | Admitting: Family Medicine

## 2021-07-04 ENCOUNTER — Other Ambulatory Visit: Payer: Self-pay

## 2021-07-04 ENCOUNTER — Other Ambulatory Visit: Payer: No Typology Code available for payment source

## 2021-07-04 DIAGNOSIS — M130 Polyarthritis, unspecified: Secondary | ICD-10-CM

## 2021-07-04 NOTE — Progress Notes (Signed)
Placed lab orders for patient to come in 

## 2021-07-08 LAB — ARTHRITIS PANEL
Basophils Absolute: 0 10*3/uL (ref 0.0–0.2)
Basos: 0 %
EOS (ABSOLUTE): 0.2 10*3/uL (ref 0.0–0.4)
Eos: 2 %
Hematocrit: 42.8 % (ref 34.0–46.6)
Hemoglobin: 14.2 g/dL (ref 11.1–15.9)
Immature Grans (Abs): 0 10*3/uL (ref 0.0–0.1)
Immature Granulocytes: 0 %
Lymphocytes Absolute: 3.2 10*3/uL — ABNORMAL HIGH (ref 0.7–3.1)
Lymphs: 42 %
MCH: 29.2 pg (ref 26.6–33.0)
MCHC: 33.2 g/dL (ref 31.5–35.7)
MCV: 88 fL (ref 79–97)
Monocytes Absolute: 0.4 10*3/uL (ref 0.1–0.9)
Monocytes: 6 %
Neutrophils Absolute: 3.7 10*3/uL (ref 1.4–7.0)
Neutrophils: 50 %
Platelets: 339 10*3/uL (ref 150–450)
RBC: 4.87 x10E6/uL (ref 3.77–5.28)
RDW: 13.2 % (ref 11.7–15.4)
Rheumatoid fact SerPl-aCnc: <10 IU/mL (ref <14.0)
Sed Rate: 10 mm/hr (ref 0–32)
Uric Acid: 5.7 mg/dL (ref 2.6–6.2)
WBC: 7.5 10*3/uL (ref 3.4–10.8)

## 2021-07-08 LAB — ACUTE HEP PANEL AND HEP B SURFACE AB
Hep A IgM: NEGATIVE
Hep B C IgM: NEGATIVE
Hep C Virus Ab: <0.1 s/co ratio (ref 0.0–0.9)
Hepatitis B Surf Ab Quant: <3.1 m[IU]/mL — ABNORMAL LOW (ref >9.9)
Hepatitis B Surface Ag: NEGATIVE

## 2021-07-08 LAB — HIGH SENSITIVITY CRP: CRP, High Sensitivity: 11.02 mg/L — ABNORMAL HIGH (ref 0.00–3.00)

## 2021-07-08 LAB — ANA,IFA RA DIAG PNL W/RFLX TIT/PATN
ANA Titer 1: NEGATIVE
Cyclic Citrullin Peptide Ab: 5 units (ref 0–19)

## 2021-07-09 LAB — ANTI-CCP AB, IGG + IGA (RDL): Anti-CCP Ab, IgG + IgA (RDL): <20 Units (ref <20)

## 2021-07-11 ENCOUNTER — Ambulatory Visit (INDEPENDENT_AMBULATORY_CARE_PROVIDER_SITE_OTHER): Payer: No Typology Code available for payment source | Admitting: Family Medicine

## 2021-07-11 ENCOUNTER — Encounter: Payer: Self-pay | Admitting: Family Medicine

## 2021-07-11 ENCOUNTER — Other Ambulatory Visit: Payer: Self-pay

## 2021-07-11 VITALS — BP 129/81 | HR 93 | Temp 97.2°F | Ht 62.0 in | Wt 262.4 lb

## 2021-07-11 DIAGNOSIS — Z79899 Other long term (current) drug therapy: Secondary | ICD-10-CM | POA: Diagnosis not present

## 2021-07-11 DIAGNOSIS — M130 Polyarthritis, unspecified: Secondary | ICD-10-CM

## 2021-07-11 DIAGNOSIS — J683 Other acute and subacute respiratory conditions due to chemicals, gases, fumes and vapors: Secondary | ICD-10-CM

## 2021-07-11 DIAGNOSIS — Z23 Encounter for immunization: Secondary | ICD-10-CM | POA: Diagnosis not present

## 2021-07-11 DIAGNOSIS — F320 Major depressive disorder, single episode, mild: Secondary | ICD-10-CM

## 2021-07-11 DIAGNOSIS — F419 Anxiety disorder, unspecified: Secondary | ICD-10-CM | POA: Diagnosis not present

## 2021-07-11 MED ORDER — ALPRAZOLAM 0.5 MG PO TABS: 0.2500 mg | ORAL_TABLET | Freq: Two times a day (BID) | ORAL | 2 refills | Status: DC | PRN

## 2021-07-11 MED ORDER — GABAPENTIN 100 MG PO CAPS: 100.0000 mg | ORAL_CAPSULE | Freq: Three times a day (TID) | ORAL | 3 refills | Status: DC

## 2021-07-11 MED ORDER — ALBUTEROL SULFATE HFA 108 (90 BASE) MCG/ACT IN AERS
2.0000 | INHALATION_SPRAY | Freq: Four times a day (QID) | RESPIRATORY_TRACT | 2 refills | Status: DC | PRN
Start: 2021-07-11 — End: 2022-02-17

## 2021-07-11 NOTE — Progress Notes (Signed)
BP 129/81   Pulse 93   Temp (!) 97.2 F (36.2 C)   Ht 5\' 2"  (1.575 m)   Wt 262 lb 6.4 oz (119 kg)   SpO2 98%   BMI 47.99 kg/m    Subjective:   Patient ID: , female    DOB: Jun 25, 1971, 50 y.o.   MRN: [id number]  HPI: Kaitlin Evans is a 50 y.o. female presenting on 07/11/2021 for Follow-up   HPI Patient is coming in today for anxiety and depression Current rx-Xanax 0.5 mg twice daily as needed # meds rx-20 Effectiveness of current meds-works well, only uses very rarely when she has really bad day. Adverse reactions form meds-none  Pill count performed-No Last drug screen -N/A ( high risk q82m, moderate risk q58m, low risk yearly ) Urine drug screen today- Yes Was the NCCSR reviewed-yes  If yes were their any concerning findings? -None  No flowsheet data found.   Controlled substance contract signed on: Today  Patient also has polyarthralgias and polymyalgias but mostly in the arthralgias including her neck shoulders wrists and hips and knees and sometimes ankles.  We did a lot of testing and came back that the CRP was elevated but everything else was negative.  We will do a referral to rheumatology and try gabapentin  Relevant past medical, surgical, family and social history reviewed and updated as indicated. Interim medical history since our last visit reviewed. Allergies and medications reviewed and updated.  Review of Systems  Constitutional:  Negative for chills and fever.  Eyes:  Negative for redness and visual disturbance.  Respiratory:  Negative for chest tightness, shortness of breath and wheezing.   Cardiovascular:  Negative for chest pain and leg swelling.  Musculoskeletal:  Positive for arthralgias, back pain, myalgias and neck pain. Negative for gait problem.  Skin:  Negative for color change and rash.  Neurological:  Negative for light-headedness and headaches.  Psychiatric/Behavioral:  Negative for agitation and behavioral problems.    All other systems reviewed and are negative.  Per HPI unless specifically indicated above   Allergies as of 07/11/2021       Reactions   Sulfa Antibiotics Nausea And Vomiting   Nausea with severe vomiting   Clindamycin/lincomycin Rash   Latex Rash        Medication List        Accurate as of July 11, 2021  8:34 AM. If you have any questions, ask your nurse or doctor.          STOP taking these medications    amoxicillin-clavulanate 875-125 MG tablet Commonly known as: AUGMENTIN Stopped by: July 13, 2021 [name], MD       TAKE these medications    Advil PM 200-25 MG Caps Generic drug: Ibuprofen-diphenhydrAMINE HCl Take by mouth.   albuterol 108 (90 Base) MCG/ACT inhaler Commonly known as: VENTOLIN HFA Inhale 2 puffs into the lungs every 6 (six) hours as needed for wheezing or shortness of breath. Started by: Elige Radon, MD   ALPRAZolam 0.5 MG tablet Commonly known as: Xanax Take 0.5-1 tablets (0.25-0.5 mg total) by mouth 2 (two) times daily as needed for anxiety.   cetirizine 10 MG tablet Commonly known as: ZyrTEC Allergy Take 1 tablet (10 mg total) by mouth daily.   cyclobenzaprine 10 MG tablet Commonly known as: FLEXERIL Take 1 tablet (10 mg total) by mouth 3 (three) times daily as needed for muscle spasms.   FLUoxetine 20 MG tablet Commonly known as: PROZAC TAKE 1 &  1/2 (ONE & ONE-HALF) TABLETS BY MOUTH ONCE DAILY . APPOINTMENT REQUIRED FOR FUTURE REFILLS   fluticasone 50 MCG/ACT nasal spray Commonly known as: FLONASE Place 1 spray into both nostrils 2 (two) times daily as needed for allergies or rhinitis.   gabapentin 100 MG capsule Commonly known as: NEURONTIN Take 1 capsule (100 mg total) by mouth 3 (three) times daily. Started by: Nils Pyle, MD   hydrochlorothiazide 25 MG tablet Commonly known as: HYDRODIURIL Take 1 tablet (25 mg total) by mouth daily.   hydrocortisone cream 0.5 % Apply 1 application topically 2 (two)  times daily.   losartan 100 MG tablet Commonly known as: COZAAR Take 1 tablet (100 mg total) by mouth daily. (Needs to be seen before next refill)         Objective:   BP 129/81   Pulse 93   Temp (!) 97.2 F (36.2 C)   Ht 5\' 2"  (1.575 m)   Wt 262 lb 6.4 oz (119 kg)   SpO2 98%   BMI 47.99 kg/m   Wt Readings from Last 3 Encounters:  07/11/21 262 lb 6.4 oz (119 kg)  05/06/21 266 lb (120.7 kg)  04/10/21 265 lb (120.2 kg)    Physical Exam Vitals and nursing note reviewed.  Constitutional:      General: She is not in acute distress.    Appearance: She is well-developed. She is not diaphoretic.  Eyes:     Conjunctiva/sclera: Conjunctivae normal.  Cardiovascular:     Rate and Rhythm: Normal rate and regular rhythm.     Heart sounds: Normal heart sounds. No murmur heard. Pulmonary:     Effort: Pulmonary effort is normal. No respiratory distress.     Breath sounds: Normal breath sounds. No wheezing.  Musculoskeletal:        General: No tenderness. Normal range of motion.  Skin:    General: Skin is warm and dry.     Findings: No rash.  Neurological:     Mental Status: She is alert and oriented to person, place, and time.     Coordination: Coordination normal.  Psychiatric:        Behavior: Behavior normal.      Assessment & Plan:   Problem List Items Addressed This Visit       Other   Anxiety   Relevant Medications   ALPRAZolam (XANAX) 0.5 MG tablet   Depression, major, single episode, mild (HCC) - Primary   Relevant Medications   ALPRAZolam (XANAX) 0.5 MG tablet   Other Visit Diagnoses     Controlled substance agreement signed       Relevant Orders   ToxASSURE Select 13 (MW), Urine   Need for immunization against influenza       Relevant Orders   Flu Vaccine QUAD 74mo+IM (Fluarix, Fluzone & Alfiuria Quad PF) (Completed)   Reactive airways dysfunction syndrome (HCC)       Relevant Medications   albuterol (VENTOLIN HFA) 108 (90 Base) MCG/ACT inhaler    Polyarthritis       Relevant Medications   gabapentin (NEURONTIN) 100 MG capsule   Other Relevant Orders   Ambulatory referral to Rheumatology       Will start gabapentin to see if it can help with her arthralgias myalgias, recommended start just once a day and evening and then increase slowly to see if she can tolerate during the daytime as well.  Did referral to rheumatology.  Refilled Xanax. Follow up plan: Return if symptoms worsen or  fail to improve.  Counseling provided for all of the vaccine components Orders Placed This Encounter  Procedures   Flu Vaccine QUAD 64mo+IM (Fluarix, Fluzone & Alfiuria Quad PF)   ToxASSURE Select 13 (MW), Urine   Ambulatory referral to Rheumatology    Arville Care, MD [facility] 07/11/2021, 8:34 AM

## 2021-07-17 LAB — TOXASSURE SELECT 13 (MW), URINE

## 2021-07-25 ENCOUNTER — Encounter: Payer: Self-pay | Admitting: Family Medicine

## 2021-08-02 ENCOUNTER — Other Ambulatory Visit: Payer: Self-pay | Admitting: Family Medicine

## 2021-08-02 DIAGNOSIS — M130 Polyarthritis, unspecified: Secondary | ICD-10-CM

## 2021-08-02 MED ORDER — GABAPENTIN 100 MG PO CAPS: 200.0000 mg | ORAL_CAPSULE | Freq: Three times a day (TID) | ORAL | 1 refills | Status: DC

## 2021-08-02 NOTE — Progress Notes (Signed)
Sent prescription for 200 mg twice
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
## 2021-08-16 ENCOUNTER — Encounter: Payer: Self-pay | Admitting: Internal Medicine

## 2021-08-16 ENCOUNTER — Ambulatory Visit (INDEPENDENT_AMBULATORY_CARE_PROVIDER_SITE_OTHER): Payer: No Typology Code available for payment source | Admitting: Internal Medicine

## 2021-08-16 ENCOUNTER — Other Ambulatory Visit: Payer: Self-pay

## 2021-08-16 VITALS — BP 136/86 | HR 88 | Resp 17 | Ht 62.5 in | Wt 263.0 lb

## 2021-08-16 DIAGNOSIS — M797 Fibromyalgia: Secondary | ICD-10-CM | POA: Diagnosis not present

## 2021-08-16 DIAGNOSIS — G8929 Other chronic pain: Secondary | ICD-10-CM

## 2021-08-16 DIAGNOSIS — M5442 Lumbago with sciatica, left side: Secondary | ICD-10-CM | POA: Diagnosis not present

## 2021-08-16 DIAGNOSIS — F419 Anxiety disorder, unspecified: Secondary | ICD-10-CM

## 2021-08-16 DIAGNOSIS — F320 Major depressive disorder, single episode, mild: Secondary | ICD-10-CM | POA: Diagnosis not present

## 2021-08-16 NOTE — Patient Instructions (Signed)
I recommend checking out the [employer] patient-centered guide for fibromyalgia and chronic pain management: °fibroguide.med.[employer].edu ° °Myofascial Pain Syndrome and Fibromyalgia °Myofascial pain syndrome and fibromyalgia are both pain disorders. This pain may be felt mainly in your muscles. °Myofascial pain syndrome: °Always has tender points in the muscle that will cause pain when pressed (trigger points). The pain may come and go. °Usually affects your neck, upper back, and shoulder areas. The pain often radiates into your arms and hands. °Fibromyalgia: °Has muscle pains and tenderness that come and go. °Is often associated with fatigue and sleep problems. °Has trigger points. °Tends to be long-lasting (chronic), but is not life-threatening. °Fibromyalgia and myofascial pain syndrome are not the same. However, they often occur together. If you have both conditions, each can make the other worse. Both are common and can cause enough pain and fatigue to make day-to-day activities difficult. Both can be hard to diagnose because their symptoms are common in many other conditions. °What are the causes? °The exact causes of these conditions are not known. °What increases the risk? °You are more likely to develop this condition if: °You have a family history of the condition. °You have certain triggers, such as: °Spine disorders. °An injury (trauma) or other physical stressors. °Being under a lot of stress. °Medical conditions such as osteoarthritis, rheumatoid arthritis, or lupus. °What are the signs or symptoms? °Fibromyalgia °The main symptom of fibromyalgia is widespread pain and tenderness in your muscles. Pain is sometimes described as stabbing, shooting, or burning. °You may also have: °Tingling or numbness. °Sleep problems and fatigue. °Problems with attention and concentration (fibro fog). °Other symptoms may include:  °Bowel and bladder problems. °Headaches. °Visual problems. °Problems with odors  and noises. °Depression or mood changes. °Painful menstrual periods (dysmenorrhea). °Dry skin or eyes. °These symptoms can vary over time. °Myofascial pain syndrome °Symptoms of myofascial pain syndrome include: °Tight, ropy bands of muscle. °Uncomfortable sensations in muscle areas. These may include aching, cramping, burning, numbness, tingling, and weakness. °Difficulty moving certain parts of the body freely (poor range of motion). °How is this diagnosed? °This condition may be diagnosed by your symptoms and medical history. You will also have a physical exam. In general: °Fibromyalgia is diagnosed if you have pain, fatigue, and other symptoms for more than 3 months, and symptoms cannot be explained by another condition. °Myofascial pain syndrome is diagnosed if you have trigger points in your muscles, and those trigger points are tender and cause pain elsewhere in your body (referred pain). °How is this treated? °Treatment for these conditions depends on the type that you have. °For fibromyalgia: °Pain medicines, such as NSAIDs. °Medicines for treating depression. °Medicines for treating seizures. °Medicines that relax the muscles. °For myofascial pain: °Pain medicines, such as NSAIDs. °Cooling and stretching of muscles. °Trigger point injections. °Sound wave (ultrasound) treatments to stimulate muscles. °Treating these conditions often requires a team of health care providers. These may include: °Your primary care provider. °Physical therapist. °Complementary health care providers, such as massage therapists or acupuncturists. °Psychiatrist for cognitive behavioral therapy. °Follow these instructions at home: °Medicines °Take over-the-counter and prescription medicines only as told by your health care provider. °Do not drive or use heavy machinery while taking prescription pain medicine. °If you are taking prescription pain medicine, take actions to prevent or treat constipation. Your health care provider may  recommend that you: °Drink enough fluid to keep your urine pale yellow. °Eat foods that are high in fiber, such as fresh fruits and   vegetables, whole grains, and beans. °Limit foods that are high in fat and processed sugars, such as fried or sweet foods. °Take an over-the-counter or prescription medicine for constipation. °Lifestyle ° °Exercise as directed by your health care provider or physical therapist. °Practice relaxation techniques to control your stress. You may want to try: °Biofeedback. °Visual imagery. °Hypnosis. °Muscle relaxation. °Yoga. °Meditation. °Maintain a healthy lifestyle. This includes eating a healthy diet and getting enough sleep. °Do not use any products that contain nicotine or tobacco, such as cigarettes and e-cigarettes. If you need help quitting, ask your health care provider. °General instructions °Talk to your health care provider about complementary treatments, such as acupuncture or massage. °Consider joining a support group with others who are diagnosed with this condition. °Do not do activities that stress or strain your muscles. This includes repetitive motions and heavy lifting. °Keep all follow-up visits as told by your health care provider. This is important. °Where to find more information °National Fibromyalgia Association: www.fmaware.org °Arthritis Foundation: www.arthritis.org °American Chronic Pain Association: www.theacpa.org °Contact a health care provider if: °You have new symptoms. °Your symptoms get worse or your pain is severe. °You have side effects from your medicines. °You have trouble sleeping. °Your condition is causing depression or anxiety. °Summary °Myofascial pain syndrome and fibromyalgia are pain disorders. °Myofascial pain syndrome has tender points in the muscle that will cause pain when pressed (trigger points). Fibromyalgia also has muscle pains and tenderness that come and go, but this condition is often associated with fatigue and sleep  disturbances. °Fibromyalgia and myofascial pain syndrome are not the same but often occur together, causing pain and fatigue that make day-to-day activities difficult. °Treatment for fibromyalgia includes taking medicines to relax the muscles and medicines for pain, depression, or seizures. Treatment for myofascial pain syndrome includes taking medicines for pain, cooling and stretching of muscles, and injecting medicines into trigger points. °Follow your health care provider's instructions for taking medicines and maintaining a healthy lifestyle. °This information is not intended to replace advice given to you by your health care provider. Make sure you discuss any questions you have with your health care provider. °Document Revised: 01/14/2019 Document Reviewed: 10/07/2017 °Elsevier Patient Education © 2022 [employer] ° °
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
## 2021-09-09 ENCOUNTER — Ambulatory Visit (INDEPENDENT_AMBULATORY_CARE_PROVIDER_SITE_OTHER): Payer: No Typology Code available for payment source | Admitting: Family Medicine

## 2021-09-09 ENCOUNTER — Encounter: Payer: Self-pay | Admitting: Family Medicine

## 2021-09-09 VITALS — BP 118/80 | HR 92 | Ht 62.5 in | Wt 262.0 lb

## 2021-09-09 DIAGNOSIS — F419 Anxiety disorder, unspecified: Secondary | ICD-10-CM | POA: Diagnosis not present

## 2021-09-09 DIAGNOSIS — M797 Fibromyalgia: Secondary | ICD-10-CM | POA: Diagnosis not present

## 2021-09-09 DIAGNOSIS — I10 Essential (primary) hypertension: Secondary | ICD-10-CM

## 2021-09-09 DIAGNOSIS — F320 Major depressive disorder, single episode, mild: Secondary | ICD-10-CM | POA: Diagnosis not present

## 2021-09-09 DIAGNOSIS — M130 Polyarthritis, unspecified: Secondary | ICD-10-CM

## 2021-09-09 MED ORDER — GABAPENTIN 300 MG PO CAPS: 300.0000 mg | ORAL_CAPSULE | Freq: Three times a day (TID) | ORAL | 3 refills | Status: DC

## 2021-09-09 NOTE — Progress Notes (Signed)
BP 118/80   Pulse 92   Ht 5' 2.5" (1.588 m)   Wt 262 lb (118.8 kg)   SpO2 98%   BMI 47.16 kg/m    Subjective:   Patient ID: Kaitlin Evans, female    DOB: August 11, 1971, 50 y.o.   MRN: [id number]  HPI: Kaitlin Evans is a 49 y.o. female presenting on 09/09/2021 for Medical Management of Chronic Issues and Fibromyalgia (Lower back, hips, legs)   HPI Fibromyalgia Patient was diagnosed with fibromyalgia by rheumatologist, she is already on gabapentin for this. She is trying to walk more and get more exercise.  She has looked up some things and fibromyalgia.  She is also taking gabapentin does help but wondering if she could take a little bit more because it is is not helping as much it was initially.  She says her pains are mostly in her legs and feet and her back  Hypertension Patient is currently on losartan hydrochlorothiazide, and their blood pressure today is 134/99, recheck is 118/80. Patient denies any lightheadedness or dizziness. Patient denies headaches, blurred vision, chest pains, shortness of breath, or weakness. Denies any side effects from medication and is content with current medication.   Anxiety depression Patient currently takes Prozac for anxiety and depression and the occasional alprazolam.  She does not use it very frequently.  She feels like she is doing good with anxiety and depression.  Denies any suicidal ideations or thoughts of hurting self. Depression screen [facility] 2/9 09/09/2021 07/11/2021 04/10/2021 01/04/2021 11/01/2020  Decreased Interest 0 0 0 0 0  Down, Depressed, Hopeless 0 0 0 0 0  PHQ - 2 Score 0 0 0 0 0  Altered sleeping 1 0 - - -  Tired, decreased energy 1 1 - - -  Change in appetite 0 0 - - -  Feeling bad or failure about yourself  0 0 - - -  Trouble concentrating 0 0 - - -  Moving slowly or fidgety/restless 0 0 - - -  Suicidal thoughts 0 0 - - -  PHQ-9 Score 2 1 - - -  Difficult doing work/chores - Not difficult at all - - -     Relevant past  medical, surgical, family and social history reviewed and updated as indicated. Interim medical history since our last visit reviewed. Allergies and medications reviewed and updated.  Review of Systems  Constitutional:  Negative for chills and fever.  Eyes:  Negative for visual disturbance.  Respiratory:  Negative for chest tightness and shortness of breath.   Cardiovascular:  Negative for chest pain and leg swelling.  Genitourinary:  Negative for difficulty urinating and dysuria.  Musculoskeletal:  Positive for myalgias. Negative for back pain and gait problem.  Skin:  Negative for rash.  Neurological:  Negative for light-headedness and headaches.  Psychiatric/Behavioral:  Negative for agitation and behavioral problems.   All other systems reviewed and are negative.  Per HPI unless specifically indicated above   Allergies as of 09/09/2021       Reactions   Sulfa Antibiotics Nausea And Vomiting   Nausea with severe vomiting   Clindamycin/lincomycin Rash   Latex Rash        Medication List        Accurate as of September 09, 2021  8:52 AM. If you have any questions, ask your nurse or doctor.          STOP taking these medications    cetirizine 10 MG tablet Commonly known as: ZyrTEC Allergy  Stopped by: Elige Radon Elwyn Lowden, MD       TAKE these medications    Advil PM 200-25 MG Caps Generic drug: Ibuprofen-diphenhydrAMINE HCl Take by mouth.   albuterol 108 (90 Base) MCG/ACT inhaler Commonly known as: VENTOLIN HFA Inhale 2 puffs into the lungs every 6 (six) hours as needed for wheezing or shortness of breath.   ALPRAZolam 0.5 MG tablet Commonly known as: Xanax Take 0.5-1 tablets (0.25-0.5 mg total) by mouth 2 (two) times daily as needed for anxiety.   cyclobenzaprine 10 MG tablet Commonly known as: FLEXERIL Take 1 tablet (10 mg total) by mouth 3 (three) times daily as needed for muscle spasms.   FLUoxetine 20 MG tablet Commonly known as: PROZAC TAKE 1 & 1/2  (ONE & ONE-HALF) TABLETS BY MOUTH ONCE DAILY . APPOINTMENT REQUIRED FOR FUTURE REFILLS   fluticasone 50 MCG/ACT nasal spray Commonly known as: FLONASE Place 1 spray into both nostrils 2 (two) times daily as needed for allergies or rhinitis.   gabapentin 300 MG capsule Commonly known as: NEURONTIN Take 1 capsule (300 mg total) by mouth 3 (three) times daily. What changed:  medication strength how much to take Changed by: Nils Pyle, MD   hydrochlorothiazide 25 MG tablet Commonly known as: HYDRODIURIL Take 1 tablet (25 mg total) by mouth daily.   hydrocortisone cream 0.5 % Apply 1 application topically 2 (two) times daily.   losartan 100 MG tablet Commonly known as: COZAAR Take 1 tablet (100 mg total) by mouth daily. (Needs to be seen before next refill)         Objective:   BP 118/80   Pulse 92   Ht 5' 2.5" (1.588 m)   Wt 262 lb (118.8 kg)   SpO2 98%   BMI 47.16 kg/m   Wt Readings from Last 3 Encounters:  09/09/21 262 lb (118.8 kg)  08/16/21 263 lb (119.3 kg)  07/11/21 262 lb 6.4 oz (119 kg)    Physical Exam Vitals and nursing note reviewed.  Constitutional:      General: She is not in acute distress.    Appearance: She is well-developed. She is not diaphoretic.  Eyes:     Conjunctiva/sclera: Conjunctivae normal.  Cardiovascular:     Rate and Rhythm: Normal rate and regular rhythm.     Heart sounds: Normal heart sounds. No murmur heard. Pulmonary:     Effort: Pulmonary effort is normal. No respiratory distress.     Breath sounds: Normal breath sounds. No wheezing.  Musculoskeletal:        General: No swelling. Normal range of motion.  Skin:    General: Skin is warm and dry.     Findings: No rash.  Neurological:     Mental Status: She is alert and oriented to person, place, and time.     Coordination: Coordination normal.  Psychiatric:        Behavior: Behavior normal.      Assessment & Plan:   Problem List Items Addressed This Visit        Cardiovascular and Mediastinum   Hypertension - Primary     Other   Fibromyalgia   Relevant Medications   gabapentin (NEURONTIN) 300 MG capsule   Anxiety   Depression, major, single episode, mild (HCC)   Other Visit Diagnoses     Polyarthritis       Relevant Medications   gabapentin (NEURONTIN) 300 MG capsule       Increase gabapentin to 300-3 times a day.  She  is mostly using it twice a day but wanted a higher dose because 200 does not seem to be working  No blood work today, blood work was good last time except for the cholesterol.  Blood pressure looks good on repeat, she does have to urinate at first. Follow up plan: Return in about 3 months (around 12/08/2021), or if symptoms worsen or fail to improve, for Fibromyalgia and hypertension.  Counseling provided for all of the vaccine components No orders of the defined types were placed in this encounter.   Caryl Pina, MD [facility] 09/09/2021, 8:52 AM

## 2021-09-10 ENCOUNTER — Ambulatory Visit: Payer: Self-pay | Admitting: Internal Medicine

## 2021-11-09 ENCOUNTER — Other Ambulatory Visit: Payer: Self-pay | Admitting: Family Medicine

## 2021-11-09 DIAGNOSIS — I10 Essential (primary) hypertension: Secondary | ICD-10-CM

## 2021-12-06 ENCOUNTER — Telehealth: Payer: Self-pay | Admitting: Family Medicine

## 2021-12-06 DIAGNOSIS — F320 Major depressive disorder, single episode, mild: Secondary | ICD-10-CM

## 2021-12-06 DIAGNOSIS — F419 Anxiety disorder, unspecified: Secondary | ICD-10-CM

## 2021-12-06 MED ORDER — FLUOXETINE HCL 20 MG PO TABS: ORAL_TABLET | ORAL | 0 refills | Status: DC

## 2021-12-06 NOTE — Telephone Encounter (Signed)
?  Prescription Request ? ?12/06/2021 ? ?Is this a "Controlled Substance" medicine? no ? ?Have you seen your PCP in the last 2 weeks? No, mad appt for 3/30  ? ?If YES, route message to pool  -  If NO, patient needs to be scheduled for appointment. ? ?What is the name of the medication or equipment? FLUoxetine (PROZAC) 20 MG tablet ? ?Have you contacted your pharmacy to request a refill? yes  ? ?Which pharmacy would you like this sent to? [facility] [address] [place] [address] (Ph: [phone]) ? ? ?Patient notified that their request is being sent to the clinical staff for review and that they should receive a response within 2 business days.  ?  ?

## 2021-12-06 NOTE — Telephone Encounter (Signed)
Sent refill to pharmacy. Notified patient and advised to make sure she keeps her appt. Patient agreed and verbalized understanding ?

## 2021-12-18 ENCOUNTER — Encounter (HOSPITAL_BASED_OUTPATIENT_CLINIC_OR_DEPARTMENT_OTHER): Payer: Self-pay

## 2021-12-18 ENCOUNTER — Emergency Department (HOSPITAL_BASED_OUTPATIENT_CLINIC_OR_DEPARTMENT_OTHER)
Admission: EM | Admit: 2021-12-18 | Discharge: 2021-12-18 | Disposition: A | Discharge status: Home / Self Care | Payer: BLUE CROSS/BLUE SHIELD | Attending: Emergency Medicine | Admitting: Emergency Medicine

## 2021-12-18 ENCOUNTER — Other Ambulatory Visit: Payer: Self-pay

## 2021-12-18 DIAGNOSIS — Z91040 Latex allergy status: Secondary | ICD-10-CM | POA: Diagnosis not present

## 2021-12-18 DIAGNOSIS — L040 Acute lymphadenitis of face, head and neck: Secondary | ICD-10-CM | POA: Diagnosis not present

## 2021-12-18 DIAGNOSIS — H9202 Otalgia, left ear: Secondary | ICD-10-CM | POA: Diagnosis present

## 2021-12-18 MED ORDER — HYDROCODONE-ACETAMINOPHEN 5-325 MG PO TABS: 1.0000 | ORAL_TABLET | ORAL | 0 refills | Status: DC | PRN

## 2021-12-18 MED ORDER — HYDROCODONE-ACETAMINOPHEN 5-325 MG PO TABS
1.0000 | ORAL_TABLET | Freq: Once | ORAL | Status: AC
  Administered 2021-12-18: 1 via ORAL
  Filled 2021-12-18: qty 1

## 2021-12-18 MED ORDER — AMOXICILLIN-POT CLAVULANATE 875-125 MG PO TABS
1.0000 | ORAL_TABLET | Freq: Once | ORAL | Status: AC
  Administered 2021-12-18: 1 via ORAL
  Filled 2021-12-18: qty 1

## 2021-12-18 MED ORDER — AMOXICILLIN-POT CLAVULANATE 875-125 MG PO TABS: 1.0000 | ORAL_TABLET | Freq: Two times a day (BID) | ORAL | 0 refills | Status: DC

## 2021-12-18 NOTE — ED Provider Notes (Signed)
?[facility] EMERGENCY DEPT ?Provider Note ? ? ?CSN: [id number] ?Arrival date & time: 12/18/21  0306 ? ?  ? ?History ? ?Chief Complaint  ?Patient presents with  ? Ear Pain  ? ? ?Kaitlin Evans is a 51 y.o. female. ? ?The history is provided by the patient.  ?She has history of hypertension, depression and comes in complaining of pain behind her left ear for the last 2 days, getting worse.  She also has noted decreased hearing out of that ear.  She denies fever, chills, sweats.  She denies any recent respiratory infections.  She had flown in an airplane, but that was 3 weeks ago.  She denies any rhinorrhea or sore throat or cough.  She has been taking acetaminophen and ibuprofen for pain without relief. ?  ?Home Medications ?Prior to Admission medications   ?Medication Sig Start Date End Date Taking? Authorizing Provider  ?albuterol (VENTOLIN HFA) 108 (90 Base) MCG/ACT inhaler Inhale 2 puffs into the lungs every 6 (six) hours as needed for wheezing or shortness of breath. 07/11/21   [name], Fransisca Kaufmann, MD  ?ALPRAZolam Duanne Moron) 0.5 MG tablet Take 0.5-1 tablets (0.25-0.5 mg total) by mouth 2 (two) times daily as needed for anxiety. 07/11/21   [name], Fransisca Kaufmann, MD  ?cyclobenzaprine (FLEXERIL) 10 MG tablet Take 1 tablet (10 mg total) by mouth 3 (three) times daily as needed for muscle spasms. 01/04/21   Gwenlyn Perking, FNP  ?FLUoxetine (PROZAC) 20 MG tablet TAKE 1 & 1/2 (ONE & ONE-HALF) TABLETS BY MOUTH ONCE DAILY . APPOINTMENT REQUIRED FOR FUTURE REFILLS 12/06/21   [name], Fransisca Kaufmann, MD  ?fluticasone (FLONASE) 50 MCG/ACT nasal spray Place 1 spray into both nostrils 2 (two) times daily as needed for allergies or rhinitis. 05/30/21   [name], Fransisca Kaufmann, MD  ?gabapentin (NEURONTIN) 300 MG capsule Take 1 capsule (300 mg total) by mouth 3 (three) times daily. 09/09/21   [name], Fransisca Kaufmann, MD  ?hydrochlorothiazide (HYDRODIURIL) 25 MG tablet Take 1 tablet by mouth once daily 11/11/21   [name], Fransisca Kaufmann, MD   ?hydrocortisone cream 0.5 % Apply 1 application topically 2 (two) times daily. 04/10/21   Ivy Lynn, NP  ?Ibuprofen-diphenhydrAMINE HCl (ADVIL PM) 200-25 MG CAPS Take by mouth.    [provider]  ?losartan (COZAAR) 100 MG tablet Take 1 tablet by mouth once daily 11/11/21   [name], Fransisca Kaufmann, MD  ?   ? ?Allergies    ?Sulfa antibiotics, Clindamycin/lincomycin, and Latex   ? ?Review of Systems   ?Review of Systems  ?All other systems reviewed and are negative. ? ?Physical Exam ?Updated Vital Signs ?BP (!) 129/94 (BP Location: Right Arm)   Pulse 89   Temp 97.8 ?F (36.6 ?C) (Oral)   Resp 20   Ht 5' 2.5" (1.588 m)   Wt 115.7 kg   SpO2 98%   BMI 45.90 kg/m?  ?Physical Exam ?Vitals and nursing note reviewed.  ?51 year old female, resting comfortably and in no acute distress. Vital signs are significant for slightly elevated diastolic blood pressure. Oxygen saturation is 98%, which is normal. ?Head is normocephalic and atraumatic. PERRLA, EOMI. Oropharynx is clear.  Tympanic membranes are clear.  No pain is elicited when tension is applied to the helix.  There is a tender left posterior auricular lymph node palpable, which is freely movable.  There is no significant mastoid tenderness.  There is no erythema or warmth of the overlying skin. ?Neck is nontender and supple without other adenopathy or JVD. ?  Back is nontender and there is no CVA tenderness. ?Lungs are clear without rales, wheezes, or rhonchi. ?Chest is nontender. ?Heart has regular rate and rhythm without murmur. ?Abdomen is soft, flat, nontender. ?Extremities have no cyanosis or edema, full range of motion is present. ?Skin is warm and dry without rash. ?Neurologic: Mental status is normal, cranial nerves are intact, moves all extremities equally. ? ?ED Results / Procedures / Treatments   ? ?Procedures ?Procedures  ? ? ?Medications Ordered in ED ?Medications  ?amoxicillin-clavulanate (AUGMENTIN) 875-125 MG per tablet 1 tablet (has no  administration in time range)  ?HYDROcodone-acetaminophen (NORCO/VICODIN) 5-325 MG per tablet 1 tablet (has no administration in time range)  ? ? ?ED Course/ Medical Decision Making/ A&P ?  ?                        ?Medical Decision Making ? ?Left ear pain with normal exam of the ear but tender postauricular lymph node.  Source of the adenitis is not clear, but she did clinically does not have mastoiditis.  She will be given a prescription for amoxicillin-clavulanic acid as well as small number of hydrocodone-acetaminophen tablets for pain control.  Advised to follow-up with ENT if not improving with antibiotics.  Old records are reviewed, and she has no relevant past visits. ? ? ? ? ? ? ? ?Final Clinical Impression(s) / ED Diagnoses ?Final diagnoses:  ?Pain in left ear  ?Acute cervical adenitis  ? ? ?Rx / DC Orders ?ED Discharge Orders   ? ?      Ordered  ?  amoxicillin-clavulanate (AUGMENTIN) 875-125 MG tablet  Every 12 hours       ? 12/18/21 0546  ?  HYDROcodone-acetaminophen (NORCO) 5-325 MG tablet  Every 4 hours PRN       ? 12/18/21 0546  ? ?  ?  ? ?  ? ? ?  ?Delora Fuel, MD ?[id number] [phone] ? ?

## 2021-12-18 NOTE — Discharge Instructions (Signed)
Take acetaminophen and/or ibuprofen as needed for less severe pain.  Reserve hydrocodone-acetaminophen for severe pain. ? ?If you start running a fever, or if symptoms are getting worse, then return to the emergency department.  If symptoms are persisting but not worsening, follow-up with the ENT physician. ?

## 2021-12-18 NOTE — ED Triage Notes (Signed)
Pt presents to the ED with left ear pain that started on Monday. States that it has seemed to affect her hearing. Pt A&Ox4 at time of triage. VSS ?

## 2021-12-20 ENCOUNTER — Ambulatory Visit (INDEPENDENT_AMBULATORY_CARE_PROVIDER_SITE_OTHER): Payer: BLUE CROSS/BLUE SHIELD | Admitting: Family Medicine

## 2021-12-20 ENCOUNTER — Encounter: Payer: Self-pay | Admitting: Family Medicine

## 2021-12-20 VITALS — BP 135/82 | HR 76 | Temp 97.7°F | Ht 62.5 in | Wt 267.0 lb

## 2021-12-20 DIAGNOSIS — H9202 Otalgia, left ear: Secondary | ICD-10-CM

## 2021-12-20 NOTE — Progress Notes (Signed)
? ?Assessment & Plan:  ?1. Left ear pain ?Recommending continuing current treatment plan as recommended by the ER. Discussed seeing an ENT would be her next steps if she continues to not improve. Note provided for her to be excused from work today; she is off the weekend and will return on Monday 12/23/2021. Advised patient to send a MyChart message on Monday if she is still not improving so that a referral to ENT can be placed.  ? ? ?Follow up plan: Return if symptoms worsen or fail to improve. ? ?Deliah Boston, MSN, APRN, FNP-C ?[facility] ? ?Subjective:  ? ?Patient ID: Kaitlin Evans, female    DOB: Dec 21, 1970, 51 y.o.   MRN: [id number] ? ?HPI: ?Kaitlin Evans is a 51 y.o. female presenting on 12/20/2021 for Er follow up (3/15 Med center- acute cervical adenitis ) ? ?Patient is here for an ER follow-up. She was seen at [facility] on 12/18/2021 where she was diagnosed with cervical adenitis and treated with Augmentin and Norco. She has been taking both medications but does not feel she is improving. They had written her out of work until today but she states she is unable to go to work due to the pain.  ? ? ?ROS: Negative unless specifically indicated above in HPI.  ? ?Relevant past medical history reviewed and updated as indicated.  ? ?Allergies and medications reviewed and updated. ? ? ?Current Outpatient Medications:  ?  albuterol (VENTOLIN HFA) 108 (90 Base) MCG/ACT inhaler, Inhale 2 puffs into the lungs every 6 (six) hours as needed for wheezing or shortness of breath., Disp: 16 g, Rfl: 2 ?  ALPRAZolam (XANAX) 0.5 MG tablet, Take 0.5-1 tablets (0.25-0.5 mg total) by mouth 2 (two) times daily as needed for anxiety., Disp: 20 tablet, Rfl: 2 ?  amoxicillin-clavulanate (AUGMENTIN) 875-125 MG tablet, Take 1 tablet by mouth every 12 (twelve) hours., Disp: 20 tablet, Rfl: 0 ?  cyclobenzaprine (FLEXERIL) 10 MG tablet, Take 1 tablet (10 mg total) by mouth 3 (three) times daily as  needed for muscle spasms., Disp: 30 tablet, Rfl: 0 ?  FLUoxetine (PROZAC) 20 MG tablet, TAKE 1 & 1/2 (ONE & ONE-HALF) TABLETS BY MOUTH ONCE DAILY . APPOINTMENT REQUIRED FOR FUTURE REFILLS, Disp: 135 tablet, Rfl: 0 ?  fluticasone (FLONASE) 50 MCG/ACT nasal spray, Place 1 spray into both nostrils 2 (two) times daily as needed for allergies or rhinitis., Disp: 16 g, Rfl: 6 ?  gabapentin (NEURONTIN) 300 MG capsule, Take 1 capsule (300 mg total) by mouth 3 (three) times daily., Disp: 270 capsule, Rfl: 3 ?  hydrochlorothiazide (HYDRODIURIL) 25 MG tablet, Take 1 tablet by mouth once daily, Disp: 90 tablet, Rfl: 1 ?  hydrocortisone cream 0.5 %, Apply 1 application topically 2 (two) times daily., Disp: 30 g, Rfl: 0 ?  Ibuprofen-diphenhydrAMINE HCl (ADVIL PM) 200-25 MG CAPS, Take by mouth., Disp: , Rfl:  ?  losartan (COZAAR) 100 MG tablet, Take 1 tablet by mouth once daily, Disp: 90 tablet, Rfl: 1 ?  HYDROcodone-acetaminophen (NORCO) 5-325 MG tablet, Take 1 tablet by mouth every 4 (four) hours as needed for moderate pain. (Patient not taking: Reported on 12/20/2021), Disp: 10 tablet, Rfl: 0 ? ?Allergies  ?Allergen Reactions  ? Sulfa Antibiotics Nausea And Vomiting  ?  Nausea with severe vomiting  ? Clindamycin/Lincomycin Rash  ? Latex Rash  ? ? ?Objective:  ? ?BP 135/82   Pulse 76   Temp 97.7 ?F (36.5 ?C) (Temporal)   Ht 5' 2.5" (  1.588 m)   Wt 267 lb (121.1 kg)   BMI 48.06 kg/m?   ? ?Physical Exam ?Vitals reviewed.  ?Constitutional:   ?   General: She is not in acute distress. ?   Appearance: Normal appearance. She is not ill-appearing, toxic-appearing or diaphoretic.  ?HENT:  ?   Head: Normocephalic and atraumatic.  ?   Right Ear: Tympanic membrane, ear canal and external ear normal. There is no impacted cerumen.  ?   Left Ear: Tympanic membrane and ear canal normal. There is no impacted cerumen. There is mastoid tenderness.  ?Eyes:  ?   General: No scleral icterus.    ?   Right eye: No discharge.     ?   Left eye: No  discharge.  ?   Conjunctiva/sclera: Conjunctivae normal.  ?Cardiovascular:  ?   Rate and Rhythm: Normal rate.  ?Pulmonary:  ?   Effort: Pulmonary effort is normal. No respiratory distress.  ?Musculoskeletal:     ?   General: Normal range of motion.  ?   Cervical back: Normal range of motion.  ?Lymphadenopathy:  ?   Cervical: Cervical adenopathy (left side) present.  ?Skin: ?   General: Skin is warm and dry.  ?   Capillary Refill: Capillary refill takes less than 2 seconds.  ?Neurological:  ?   General: No focal deficit present.  ?   Mental Status: She is alert and oriented to person, place, and time. Mental status is at baseline.  ?Psychiatric:     ?   Mood and Affect: Mood normal.     ?   Behavior: Behavior normal.     ?   Thought Content: Thought content normal.     ?   Judgment: Judgment normal.  ? ? ? ? ? ? ?

## 2022-01-02 ENCOUNTER — Ambulatory Visit: Payer: No Typology Code available for payment source | Admitting: Family Medicine

## 2022-01-08 ENCOUNTER — Encounter: Payer: Self-pay | Admitting: Family Medicine

## 2022-02-13 ENCOUNTER — Ambulatory Visit: Payer: No Typology Code available for payment source | Admitting: Family Medicine

## 2022-02-17 ENCOUNTER — Encounter: Payer: Self-pay | Admitting: Family Medicine

## 2022-02-17 ENCOUNTER — Ambulatory Visit (INDEPENDENT_AMBULATORY_CARE_PROVIDER_SITE_OTHER): Payer: BC Managed Care – PPO | Admitting: Family Medicine

## 2022-02-17 VITALS — BP 137/90 | HR 96 | Ht 62.5 in | Wt 264.0 lb

## 2022-02-17 DIAGNOSIS — F419 Anxiety disorder, unspecified: Secondary | ICD-10-CM

## 2022-02-17 DIAGNOSIS — J0100 Acute maxillary sinusitis, unspecified: Secondary | ICD-10-CM

## 2022-02-17 DIAGNOSIS — F320 Major depressive disorder, single episode, mild: Secondary | ICD-10-CM

## 2022-02-17 DIAGNOSIS — I10 Essential (primary) hypertension: Secondary | ICD-10-CM | POA: Diagnosis not present

## 2022-02-17 DIAGNOSIS — M797 Fibromyalgia: Secondary | ICD-10-CM

## 2022-02-17 DIAGNOSIS — J683 Other acute and subacute respiratory conditions due to chemicals, gases, fumes and vapors: Secondary | ICD-10-CM

## 2022-02-17 DIAGNOSIS — Z1211 Encounter for screening for malignant neoplasm of colon: Secondary | ICD-10-CM

## 2022-02-17 DIAGNOSIS — M545 Low back pain, unspecified: Secondary | ICD-10-CM

## 2022-02-17 MED ORDER — ALBUTEROL SULFATE HFA 108 (90 BASE) MCG/ACT IN AERS: 2.0000 | INHALATION_SPRAY | Freq: Four times a day (QID) | RESPIRATORY_TRACT | 2 refills | Status: DC | PRN

## 2022-02-17 MED ORDER — FLUTICASONE PROPIONATE 50 MCG/ACT NA SUSP: 1.0000 | Freq: Two times a day (BID) | NASAL | 3 refills | Status: AC | PRN

## 2022-02-17 MED ORDER — FLUOXETINE HCL 20 MG PO TABS: 30.0000 mg | ORAL_TABLET | Freq: Every day | ORAL | 3 refills | Status: DC

## 2022-02-17 MED ORDER — CYCLOBENZAPRINE HCL 10 MG PO TABS: 10.0000 mg | ORAL_TABLET | Freq: Three times a day (TID) | ORAL | 5 refills | Status: DC | PRN

## 2022-02-17 NOTE — Progress Notes (Signed)
? ?BP 137/90   Pulse 96   Ht 5' 2.5" (1.588 m)   Wt 264 lb (119.7 kg)   SpO2 97%   BMI 47.52 kg/m?   ? ?Subjective:  ? ?Patient ID: Kaitlin Evans, female    DOB: Nov 18, 1970, 51 y.o.   MRN: [id number] ? ?HPI: ?Kaitlin Evans is a 51 y.o. female presenting on 02/17/2022 for Medical Management of Chronic Issues and Hypertension ? ? ?HPI ?Hypertension ?Patient is currently on hydrochlorothiazide, and their blood pressure today is 137/90. Patient denies any lightheadedness or dizziness. Patient denies headaches, blurred vision, chest pains, shortness of breath, or weakness. Denies any side effects from medication and is content with current medication.  ? ?Depression and anxiety recheck and fibromyalgia ?Patient has attempted to take gabapentin along with her Prozac to help with her fibromyalgia and anxiety depression.  She says the gabapentin did not do well for and made her feel out of mind and did not really help.  She does use the occasional alprazolam as well but very rarely needs to refill it.  She says her myalgias her biggest complaint. ? ?  02/17/2022  ?  3:12 PM 12/20/2021  ? 10:20 AM 09/09/2021  ?  8:04 AM 07/11/2021  ?  8:08 AM 04/10/2021  ?  3:49 PM  ?Depression screen PHQ 2/9  ?Decreased Interest 0 0 0 0 0  ?Down, Depressed, Hopeless 0 0 0 0 0  ?PHQ - 2 Score 0 0 0 0 0  ?Altered sleeping 1  1 0   ?Tired, decreased energy 1  1 1    ?Change in appetite 0  0 0   ?Feeling bad or failure about yourself  0  0 0   ?Trouble concentrating 0  0 0   ?Moving slowly or fidgety/restless 0  0 0   ?Suicidal thoughts 0  0 0   ?PHQ-9 Score 2  2 1    ?Difficult doing work/chores Not difficult at all   Not difficult at all   ?  ? ?Patient needs refills of sinus medicine and inhaler for asthma, she has been stable and has not really needed them that much but she does keep them on hand and they are expired. ? ?Relevant past medical, surgical, family and social history reviewed and updated as indicated. Interim medical history  since our last visit reviewed. ?Allergies and medications reviewed and updated. ? ?Review of Systems  ?Constitutional:  Negative for chills and fever.  ?HENT:  Negative for congestion.   ?Eyes:  Negative for visual disturbance.  ?Respiratory:  Negative for cough, chest tightness and shortness of breath.   ?Cardiovascular:  Negative for chest pain and leg swelling.  ?Musculoskeletal:  Positive for arthralgias and myalgias. Negative for back pain and gait problem.  ?Skin:  Negative for rash.  ?Neurological:  Negative for light-headedness and headaches.  ?Psychiatric/Behavioral:  Negative for agitation and behavioral problems.   ?All other systems reviewed and are negative. ? ?Per HPI unless specifically indicated above ? ? ?Allergies as of 02/17/2022   ? ?   Reactions  ? Sulfa Antibiotics Nausea And Vomiting  ? Nausea with severe vomiting  ? Clindamycin/lincomycin Rash  ? Latex Rash  ? ?  ? ?  ?Medication List  ?  ? ?  ? Accurate as of Feb 17, 2022  3:31 PM. If you have any questions, ask your nurse or doctor.  ?  ?  ? ?  ? ?STOP taking these medications   ? ?amoxicillin-clavulanate 875-125 MG tablet ?  Commonly known as: AUGMENTIN ?Stopped by: Nils PyleJoshua A Jim Philemon, MD ?  ?gabapentin 300 MG capsule ?Commonly known as: NEURONTIN ?Stopped by: Nils PyleJoshua A Naeem Quillin, MD ?  ?HYDROcodone-acetaminophen 5-325 MG tablet ?Commonly known as: Norco ?Stopped by: Nils PyleJoshua A Adrain Nesbit, MD ?  ?hydrocortisone cream 0.5 % ?Stopped by: Nils PyleJoshua A Charolette Bultman, MD ?  ? ?  ? ?TAKE these medications   ? ?Advil PM 200-25 MG Caps ?Generic drug: Ibuprofen-diphenhydrAMINE HCl ?Take by mouth. ?  ?albuterol 108 (90 Base) MCG/ACT inhaler ?Commonly known as: VENTOLIN HFA ?Inhale 2 puffs into the lungs every 6 (six) hours as needed for wheezing or shortness of breath. ?  ?ALPRAZolam 0.5 MG tablet ?Commonly known as: Xanax ?Take 0.5-1 tablets (0.25-0.5 mg total) by mouth 2 (two) times daily as needed for anxiety. ?  ?cyclobenzaprine 10 MG tablet ?Commonly known  as: FLEXERIL ?Take 1 tablet (10 mg total) by mouth 3 (three) times daily as needed for muscle spasms. ?  ?FLUoxetine 20 MG tablet ?Commonly known as: PROZAC ?Take 1.5 tablets (30 mg total) by mouth daily. ?What changed:  ?how much to take ?how to take this ?when to take this ?additional instructions ?Changed by: Nils PyleJoshua A Cacey Willow, MD ?  ?fluticasone 50 MCG/ACT nasal spray ?Commonly known as: FLONASE ?Place 1 spray into both nostrils 2 (two) times daily as needed for allergies or rhinitis. ?  ?hydrochlorothiazide 25 MG tablet ?Commonly known as: HYDRODIURIL ?Take 1 tablet by mouth once daily ?  ?losartan 100 MG tablet ?Commonly known as: COZAAR ?Take 1 tablet by mouth once daily ?  ? ?  ? ? ? ?Objective:  ? ?BP 137/90   Pulse 96   Ht 5' 2.5" (1.588 m)   Wt 264 lb (119.7 kg)   SpO2 97%   BMI 47.52 kg/m?   ?Wt Readings from Last 3 Encounters:  ?02/17/22 264 lb (119.7 kg)  ?12/20/21 267 lb (121.1 kg)  ?12/18/21 255 lb (115.7 kg)  ?  ?Physical Exam ?Vitals and nursing note reviewed.  ?Constitutional:   ?   General: She is not in acute distress. ?   Appearance: She is well-developed. She is not diaphoretic.  ?Eyes:  ?   Conjunctiva/sclera: Conjunctivae normal.  ?Cardiovascular:  ?   Rate and Rhythm: Normal rate and regular rhythm.  ?   Heart sounds: Normal heart sounds. No murmur heard. ?Pulmonary:  ?   Effort: Pulmonary effort is normal. No respiratory distress.  ?   Breath sounds: Normal breath sounds. No wheezing.  ?Musculoskeletal:     ?   General: No swelling or tenderness. Normal range of motion.  ?Skin: ?   General: Skin is warm and dry.  ?   Findings: No rash.  ?Neurological:  ?   Mental Status: She is alert and oriented to person, place, and time.  ?   Coordination: Coordination normal.  ?Psychiatric:     ?   Behavior: Behavior normal.  ? ? ? ? ?Assessment & Plan:  ? ?Problem List Items Addressed This Visit   ? ?  ? Cardiovascular and Mediastinum  ? Hypertension  ?  ? Other  ? Anxiety - Primary  ? Relevant  Medications  ? FLUoxetine (PROZAC) 20 MG tablet  ? Depression, major, single episode, mild (HCC)  ? Relevant Medications  ? FLUoxetine (PROZAC) 20 MG tablet  ? Fibromyalgia  ? Relevant Medications  ? cyclobenzaprine (FLEXERIL) 10 MG tablet  ? FLUoxetine (PROZAC) 20 MG tablet  ? Low back pain  ? Relevant Medications  ?  cyclobenzaprine (FLEXERIL) 10 MG tablet  ? ?Other Visit Diagnoses   ? ? Colon cancer screening      ? Relevant Orders  ? Cologuard  ? Cologuard  ? Reactive airways dysfunction syndrome (HCC)      ? Relevant Medications  ? albuterol (VENTOLIN HFA) 108 (90 Base) MCG/ACT inhaler  ? Acute non-recurrent maxillary sinusitis      ? Relevant Medications  ? fluticasone (FLONASE) 50 MCG/ACT nasal spray  ? ?  ? Patient does not want blood work today, wants to wait till next time.  She has a sick child was can go over and be with her and her visit. ? ?Follow up plan: ?Return in about 6 months (around 08/20/2022), or if symptoms worsen or fail to improve, for Physical exam and recheck fibromyalgia and hypertension and anxiety. ? ?Counseling provided for all of the vaccine components ?Orders Placed This Encounter  ?Procedures  ? Cologuard  ? Cologuard  ? ? ?Arville Care, MD ?[facility] ?02/17/2022, 3:31 PM ? ? ? ? ?

## 2022-03-24 ENCOUNTER — Other Ambulatory Visit: Payer: Self-pay | Admitting: Family Medicine

## 2022-03-24 DIAGNOSIS — I10 Essential (primary) hypertension: Secondary | ICD-10-CM

## 2022-08-14 ENCOUNTER — Other Ambulatory Visit: Payer: Self-pay | Admitting: Family Medicine

## 2022-08-14 DIAGNOSIS — F320 Major depressive disorder, single episode, mild: Secondary | ICD-10-CM

## 2022-08-14 DIAGNOSIS — F419 Anxiety disorder, unspecified: Secondary | ICD-10-CM

## 2022-09-14 ENCOUNTER — Other Ambulatory Visit: Payer: Self-pay | Admitting: Family Medicine

## 2022-09-14 DIAGNOSIS — F419 Anxiety disorder, unspecified: Secondary | ICD-10-CM

## 2022-09-14 DIAGNOSIS — F320 Major depressive disorder, single episode, mild: Secondary | ICD-10-CM

## 2022-09-15 MED ORDER — FLUOXETINE HCL 20 MG PO TABS: 30.0000 mg | ORAL_TABLET | Freq: Every day | ORAL | 0 refills | Status: DC

## 2022-09-15 NOTE — Addendum Note (Signed)
Addended by: Julious Payer D on: 09/15/2022 11:20 AM   Modules accepted: Orders

## 2022-09-15 NOTE — Telephone Encounter (Signed)
Pt scheduled CPE appt with Dr. Algis Downs. On 10/31/2022, will run out of meds in a few days please send refill to [facility]

## 2022-09-15 NOTE — Telephone Encounter (Signed)
[name] NTBS 30 days given 08/14/22

## 2022-09-18 ENCOUNTER — Encounter: Payer: Self-pay | Admitting: Family Medicine

## 2022-09-18 ENCOUNTER — Ambulatory Visit: Payer: BC Managed Care – PPO | Admitting: Family Medicine

## 2022-09-18 VITALS — BP 132/80 | HR 84 | Temp 97.2°F | Ht 62.5 in | Wt 264.4 lb

## 2022-09-18 DIAGNOSIS — L309 Dermatitis, unspecified: Secondary | ICD-10-CM | POA: Diagnosis not present

## 2022-09-18 MED ORDER — TRIAMCINOLONE ACETONIDE 0.1 % EX CREA: 1.0000 | TOPICAL_CREAM | Freq: Two times a day (BID) | CUTANEOUS | 0 refills | Status: DC

## 2022-09-18 NOTE — Progress Notes (Signed)
Subjective:  Patient ID: Kaitlin Evans, female    DOB: [birth year], 51 y.o.   MRN: [id number]  Patient Care Team: [name], Elige Radon, MD as PCP - General (Family Medicine)   Chief Complaint:  Rash (Patient states she has a rash that has spread all over he body x 2 months- itches )   HPI: Kaitlin Evans is a 51 y.o. female presenting on 09/18/2022 for Rash (Patient states she has a rash that has spread all over he body x 2 months- itches )   Rash This is a recurrent problem. Episode onset: 2 months ago. The problem has been waxing and waning since onset. The affected locations include the left lower leg, left upper leg, right upper leg, right lower leg, face and back. The rash is characterized by dryness, itchiness, redness and scaling. She was exposed to nothing. Pertinent negatives include no anorexia, congestion, cough, diarrhea, eye pain, facial edema, fatigue, fever, joint pain, nail changes, rhinorrhea, shortness of breath, sore throat or vomiting. Past treatments include anti-itch cream, moisturizer and topical steroids. The treatment provided no relief.     Relevant past medical, surgical, family, and social history reviewed and updated as indicated.  Allergies and medications reviewed and updated. Data reviewed: Chart in Epic.   Past Medical History:  Diagnosis Date   Anxiety    Arthritis    Depression    Hypertension    Sleep apnea     Past Surgical History:  Procedure Laterality Date   ABLATION  2009   CESAREAN SECTION  2005   CESAREAN SECTION  2008   D & C  2004   SALIVARY GLAND SURGERY     stones, had it removed    Social History   Socioeconomic History   Marital status: Married    Spouse name: Not on file   Number of children: Not on file   Years of education: Not on file   Highest education level: Not on file  Occupational History   Not on file  Tobacco Use   Smoking status: Every Day    Packs/day: 0.50    Years: 18.00    Total pack  years: 9.00    Types: Cigarettes    Last attempt to quit: 04/11/2015    Years since quitting: 7.4   Smokeless tobacco: Never  Vaping Use   Vaping Use: Former  Substance and Sexual Activity   Alcohol use: Yes    Comment: occasional   Drug use: No   Sexual activity: Yes    Birth control/protection: Surgical    Comment: surgery endometrial ablation 2009  Other Topics Concern   Not on file  Social History Narrative   Not on file   Social Determinants of Health   Financial Resource Strain: Not on file  Food Insecurity: Not on file  Transportation Needs: Not on file  Physical Activity: Not on file  Stress: Not on file  Social Connections: Not on file  Intimate Partner Violence: Not on file    Outpatient Encounter Medications as of 09/18/2022  Medication Sig   albuterol (VENTOLIN HFA) 108 (90 Base) MCG/ACT inhaler Inhale 2 puffs into the lungs every 6 (six) hours as needed for wheezing or shortness of breath.   ALPRAZolam (XANAX) 0.5 MG tablet Take 0.5-1 tablets (0.25-0.5 mg total) by mouth 2 (two) times daily as needed for anxiety.   cyclobenzaprine (FLEXERIL) 10 MG tablet Take 1 tablet (10 mg total) by mouth 3 (three) times daily as needed for  muscle spasms.   FLUoxetine (PROZAC) 20 MG tablet Take 1.5 tablets (30 mg total) by mouth daily.   fluticasone (FLONASE) 50 MCG/ACT nasal spray Place 1 spray into both nostrils 2 (two) times daily as needed for allergies or rhinitis.   hydrochlorothiazide (HYDRODIURIL) 25 MG tablet TAKE 1 TABLET BY MOUTH ONCE DAILY   Ibuprofen-diphenhydrAMINE HCl (ADVIL PM) 200-25 MG CAPS Take by mouth.   losartan (COZAAR) 100 MG tablet TAKE 1 TABLET BY MOUTH ONCE DAILY   triamcinolone cream (KENALOG) 0.1 % Apply 1 Application topically 2 (two) times daily.   No facility-administered encounter medications on file as of 09/18/2022.    Allergies  Allergen Reactions   Clindamycin/Lincomycin Rash   Latex Rash   Sulfa Antibiotics Nausea And Vomiting and  Other (See Comments)    Nausea with severe vomiting  Nausea with severe vomiting    Nausea with severe vomiting Nausea with severe vomiting Nausea with severe vomiting Nausea with severe vomiting    Review of Systems  Constitutional:  Negative for activity change, appetite change, chills, diaphoresis, fatigue, fever and unexpected weight change.  HENT:  Negative for congestion, rhinorrhea and sore throat.   Eyes:  Negative for photophobia, pain, discharge, itching and visual disturbance.  Respiratory:  Negative for cough and shortness of breath.   Cardiovascular:  Negative for chest pain, palpitations and leg swelling.  Gastrointestinal:  Negative for abdominal pain, anorexia, diarrhea and vomiting.  Endocrine: Negative for polydipsia, polyphagia and polyuria.  Genitourinary:  Negative for decreased urine volume and difficulty urinating.  Musculoskeletal:  Negative for arthralgias and joint pain.  Skin:  Positive for color change and rash. Negative for nail changes, pallor and wound.  Neurological:  Negative for dizziness, tremors, seizures, syncope, facial asymmetry, speech difficulty, weakness, light-headedness, numbness and headaches.  Psychiatric/Behavioral:  Negative for confusion.   All other systems reviewed and are negative.       Objective:  BP 132/80   Pulse 84   Temp (!) 97.2 F (36.2 C) (Temporal)   Ht 5' 2.5" (1.588 m)   Wt 264 lb 6.4 oz (119.9 kg)   SpO2 98%   BMI 47.59 kg/m    Wt Readings from Last 3 Encounters:  09/18/22 264 lb 6.4 oz (119.9 kg)  02/17/22 264 lb (119.7 kg)  12/20/21 267 lb (121.1 kg)    Physical Exam Vitals and nursing note reviewed.  Constitutional:      General: She is not in acute distress.    Appearance: Normal appearance. She is obese. She is not ill-appearing, toxic-appearing or diaphoretic.  HENT:     Mouth/Throat:     Mouth: Mucous membranes are moist.  Eyes:     Conjunctiva/sclera: Conjunctivae normal.     Pupils: Pupils  are equal, round, and reactive to light.  Cardiovascular:     Rate and Rhythm: Normal rate and regular rhythm.  Pulmonary:     Effort: Pulmonary effort is normal.     Breath sounds: Normal breath sounds.  Skin:    General: Skin is warm and dry.     Capillary Refill: Capillary refill takes less than 2 seconds.     Findings: Erythema and rash present. Rash is papular.     Comments: Clusters of erythematous papular patches to lower legs and behind knees, patch to nose  Neurological:     General: No focal deficit present.     Mental Status: She is alert and oriented to person, place, and time.  Psychiatric:  Mood and Affect: Mood normal.        Behavior: Behavior normal.        Thought Content: Thought content normal.        Judgment: Judgment normal.     Results for orders placed or performed in visit on 07/11/21  ToxASSURE Select 13 (MW), Urine  Result Value Ref Range   Summary Note        Pertinent labs & imaging results that were available during my care of the patient were reviewed by me and considered in my medical decision making.  Assessment & Plan:  Ysidra was seen today for rash.  Diagnoses and all orders for this visit:  Eczema of lower leg Eczema discussed in detail. Jar emollients and avoid triggers. Medications as prescribed. Report new, worsening, or persistent symptoms. -     triamcinolone cream (KENALOG) 0.1 %; Apply 1 Application topically 2 (two) times daily.  Eczema of face Symptomatic care discussed in detail. Aware to report new, worsening, or persistent symptoms.     Continue all other maintenance medications.  Follow up plan: Return if symptoms worsen or fail to improve.   Continue healthy lifestyle choices, including diet (rich in fruits, vegetables, and lean proteins, and low in salt and simple carbohydrates) and exercise (at least 30 minutes of moderate physical activity daily).  Educational handout given for eczema   The above  assessment and management plan was discussed with the patient. The patient verbalized understanding of and has agreed to the management plan. Patient is aware to call the clinic if they develop any new symptoms or if symptoms persist or worsen. Patient is aware when to return to the clinic for a follow-up visit. Patient educated on when it is appropriate to go to the emergency department.   Kari Baars, FNP-C [facility] [phone]

## 2022-10-31 ENCOUNTER — Ambulatory Visit (INDEPENDENT_AMBULATORY_CARE_PROVIDER_SITE_OTHER): Payer: BC Managed Care – PPO | Admitting: Family Medicine

## 2022-10-31 ENCOUNTER — Encounter: Payer: Self-pay | Admitting: Family Medicine

## 2022-10-31 VITALS — BP 133/81 | HR 77 | Ht 62.5 in | Wt 266.0 lb

## 2022-10-31 DIAGNOSIS — M797 Fibromyalgia: Secondary | ICD-10-CM

## 2022-10-31 DIAGNOSIS — F320 Major depressive disorder, single episode, mild: Secondary | ICD-10-CM | POA: Diagnosis not present

## 2022-10-31 DIAGNOSIS — F419 Anxiety disorder, unspecified: Secondary | ICD-10-CM

## 2022-10-31 DIAGNOSIS — Z0001 Encounter for general adult medical examination with abnormal findings: Secondary | ICD-10-CM | POA: Diagnosis not present

## 2022-10-31 DIAGNOSIS — I10 Essential (primary) hypertension: Secondary | ICD-10-CM | POA: Diagnosis not present

## 2022-10-31 DIAGNOSIS — Z0000 Encounter for general adult medical examination without abnormal findings: Secondary | ICD-10-CM

## 2022-10-31 MED ORDER — ALPRAZOLAM 0.5 MG PO TABS: 0.2500 mg | ORAL_TABLET | Freq: Two times a day (BID) | ORAL | 2 refills | Status: DC | PRN

## 2022-10-31 MED ORDER — DULOXETINE HCL 30 MG PO CPEP: 30.0000 mg | ORAL_CAPSULE | Freq: Every day | ORAL | 2 refills | Status: DC

## 2022-10-31 MED ORDER — HYDROCHLOROTHIAZIDE 25 MG PO TABS: 25.0000 mg | ORAL_TABLET | Freq: Every day | ORAL | 3 refills | Status: DC

## 2022-10-31 MED ORDER — LOSARTAN POTASSIUM 100 MG PO TABS: 100.0000 mg | ORAL_TABLET | Freq: Every day | ORAL | 3 refills | Status: DC

## 2022-10-31 NOTE — Progress Notes (Signed)
BP 133/81   Pulse 77   Ht 5' 2.5" (1.588 m)   Wt 266 lb (120.7 kg)   SpO2 97%   BMI 47.88 kg/m    Subjective:   Patient ID: Kaitlin Evans, female    DOB: 1970/12/04, 52 y.o.   MRN: [id number]  HPI: Kaitlin Evans is a 52 y.o. female presenting on 10/31/2022 for Medical Management of Chronic Issues and Hypertension   HPI Anxiety and depression recheck Current rx-Xanax 0.25 to 0.5 mg twice daily as needed, last prescription was filled in October 2022.  Also takes Prozac, she feels like her Prozac has not been working as well.  She does want changes she wants something else.  She is been getting more myalgias and fibromyalgia as but also she has been getting more anxiety and depression.  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  Patient denies any suicidal ideations or thoughts of hurting self. # meds rx-20 Effectiveness of current meds-she does not take it very often but works when she needs it Adverse reactions form meds-none  Pill count performed-No Last drug screen -07/30/2019 ( high risk q81m, moderate risk q70m, low risk yearly ) Urine drug screen today- Yes Was the [place] reviewed-yes  If yes were their any concerning findings? -None  No flowsheet data found.   Controlled substance contract signed on: Today  Hypertension Patient is currently on losartan and hydrochlorothiazide, and their blood pressure today is 133/81. Patient denies any lightheadedness or dizziness. Patient denies headaches, blurred vision, chest pains, shortness of breath, or weakness. Denies any side effects from medication and is content with current medication.   Rash Patient is still having a rash on her legs and some on her arms as well.  She has been struggling with this for few months.  She was seen by another provider here and has been using with some Cetaphil and triamcinolone to  help with it and it does not seem to be helping ultimately.  It does get better a little bit and then gets worse again.  Patient is still having polymyalgias and fibromyalgias and is not doing better.  She wants to screen for inflammatory measures again.  We will also switch her to Cymbalta and see if that helps a little better with her.  Relevant past medical, surgical, family and social history reviewed and updated as indicated. Interim medical history since our last visit reviewed. Allergies and medications reviewed and updated.  Review of Systems  Constitutional:  Negative for chills and fever.  HENT:  Negative for congestion, ear discharge and ear pain.   Eyes:  Negative for redness and visual disturbance.  Respiratory:  Negative for chest tightness and shortness of breath.   Cardiovascular:  Negative for chest pain and leg swelling.  Genitourinary:  Negative for difficulty urinating and dysuria.  Musculoskeletal:  Positive for myalgias. Negative for back pain and gait problem.  Skin:  Negative for rash.  Neurological:  Negative for dizziness, light-headedness and headaches.  Psychiatric/Behavioral:  Positive for dysphoric mood. Negative for agitation, behavioral problems, self-injury, sleep disturbance and suicidal ideas. The patient is nervous/anxious.   All other systems reviewed and are negative.   Per HPI unless specifically indicated above   Allergies as of 10/31/2022       Reactions   Clindamycin/lincomycin Rash   Latex Rash   Sulfa Antibiotics  Nausea And Vomiting, Other (See Comments)   Nausea with severe vomiting Nausea with severe vomiting    Nausea with severe vomiting Nausea with severe vomiting Nausea with severe vomiting Nausea with severe vomiting        Medication List        Accurate as of October 31, 2022 11:45 AM. If you have any questions, ask your nurse or doctor.          STOP taking these medications    Advil PM 200-25 MG Caps Generic  drug: Ibuprofen-diphenhydrAMINE HCl Stopped by: Elige Radon Talik Casique, MD   FLUoxetine 20 MG tablet Commonly known as: PROZAC Stopped by: Nils Pyle, MD       TAKE these medications    albuterol 108 (90 Base) MCG/ACT inhaler Commonly known as: VENTOLIN HFA Inhale 2 puffs into the lungs every 6 (six) hours as needed for wheezing or shortness of breath.   ALPRAZolam 0.5 MG tablet Commonly known as: Xanax Take 0.5-1 tablets (0.25-0.5 mg total) by mouth 2 (two) times daily as needed for anxiety.   cyclobenzaprine 10 MG tablet Commonly known as: FLEXERIL Take 1 tablet (10 mg total) by mouth 3 (three) times daily as needed for muscle spasms.   DULoxetine 30 MG capsule Commonly known as: Cymbalta Take 1 capsule (30 mg total) by mouth daily. Started by: Elige Radon Edythe Riches, MD   fluticasone 50 MCG/ACT nasal spray Commonly known as: FLONASE Place 1 spray into both nostrils 2 (two) times daily as needed for allergies or rhinitis.   hydrochlorothiazide 25 MG tablet Commonly known as: HYDRODIURIL Take 1 tablet (25 mg total) by mouth daily.   losartan 100 MG tablet Commonly known as: COZAAR Take 1 tablet (100 mg total) by mouth daily.   triamcinolone cream 0.1 % Commonly known as: KENALOG Apply 1 Application topically 2 (two) times daily.         Objective:   BP 133/81   Pulse 77   Ht 5' 2.5" (1.588 m)   Wt 266 lb (120.7 kg)   SpO2 97%   BMI 47.88 kg/m   Wt Readings from Last 3 Encounters:  10/31/22 266 lb (120.7 kg)  09/18/22 264 lb 6.4 oz (119.9 kg)  02/17/22 264 lb (119.7 kg)    Physical Exam Vitals and nursing note reviewed.  Constitutional:      General: She is not in acute distress.    Appearance: She is well-developed. She is not diaphoretic.  Eyes:     Conjunctiva/sclera: Conjunctivae normal.  Cardiovascular:     Rate and Rhythm: Normal rate and regular rhythm.     Heart sounds: Normal heart sounds. No murmur heard. Pulmonary:     Effort:  Pulmonary effort is normal. No respiratory distress.     Breath sounds: Normal breath sounds. No wheezing.  Abdominal:     General: Abdomen is flat. Bowel sounds are normal. There is no distension.     Tenderness: There is no abdominal tenderness. There is no guarding or rebound.  Musculoskeletal:        General: No tenderness. Normal range of motion.  Skin:    General: Skin is warm and dry.     Findings: No rash.  Neurological:     Mental Status: She is alert and oriented to person, place, and time.     Coordination: Coordination normal.  Psychiatric:        Behavior: Behavior normal.       Assessment & Plan:   Problem List  Items Addressed This Visit       Cardiovascular and Mediastinum   Hypertension   Relevant Medications   hydrochlorothiazide (HYDRODIURIL) 25 MG tablet   losartan (COZAAR) 100 MG tablet   Other Relevant Orders   CMP14+EGFR   Lipid panel     Other   Anxiety   Relevant Medications   ALPRAZolam (XANAX) 0.5 MG tablet   DULoxetine (CYMBALTA) 30 MG capsule   Other Relevant Orders   CBC with Differential/Platelet   ToxASSURE Select 13 (MW), Urine   Depression, major, single episode, mild (HCC)   Relevant Medications   ALPRAZolam (XANAX) 0.5 MG tablet   DULoxetine (CYMBALTA) 30 MG capsule   Other Relevant Orders   ToxASSURE Select 13 (MW), Urine   Fibromyalgia   Relevant Medications   DULoxetine (CYMBALTA) 30 MG capsule   Other Relevant Orders   Sedimentation rate   High sensitivity CRP   Other Visit Diagnoses     Physical exam    -  Primary   Relevant Orders   CBC with Differential/Platelet   CMP14+EGFR   Essential hypertension       Relevant Medications   hydrochlorothiazide (HYDRODIURIL) 25 MG tablet   losartan (COZAAR) 100 MG tablet   Other Relevant Orders   CBC with Differential/Platelet   CMP14+EGFR   Lipid panel       Will transition her from Prozac to Cymbalta.  She is to taper off the Prozac over the next week, gave  instructions for that and then she will start the Cymbalta next week.  For her atopic dermatitis recommended that she start taking an antihistamine both in the day and at night along with the creams that she is using currently, if that does not improve then we may need to see dermatology.. Follow up plan: Return if symptoms worsen or fail to improve, for 1 to 27-month anxiety recheck.  Counseling provided for all of the vaccine components Orders Placed This Encounter  Procedures   CBC with Differential/Platelet   CMP14+EGFR   Lipid panel   ToxASSURE Select 13 (MW), Urine   Sedimentation rate   High sensitivity CRP    Caryl Pina, MD [facility] 10/31/2022, 11:45 AM

## 2022-11-01 LAB — CBC WITH DIFFERENTIAL/PLATELET
Basophils Absolute: 0.1 10*3/uL (ref 0.0–0.2)
Basos: 1 %
EOS (ABSOLUTE): 0.2 10*3/uL (ref 0.0–0.4)
Eos: 2 %
Hematocrit: 43.4 % (ref 34.0–46.6)
Hemoglobin: 14.4 g/dL (ref 11.1–15.9)
Immature Grans (Abs): 0 10*3/uL (ref 0.0–0.1)
Immature Granulocytes: 0 %
Lymphocytes Absolute: 3.5 10*3/uL — ABNORMAL HIGH (ref 0.7–3.1)
Lymphs: 49 %
MCH: 28.7 pg (ref 26.6–33.0)
MCHC: 33.2 g/dL (ref 31.5–35.7)
MCV: 87 fL (ref 79–97)
Monocytes Absolute: 0.5 10*3/uL (ref 0.1–0.9)
Monocytes: 7 %
Neutrophils Absolute: 3 10*3/uL (ref 1.4–7.0)
Neutrophils: 41 %
Platelets: 341 10*3/uL (ref 150–450)
RBC: 5.01 x10E6/uL (ref 3.77–5.28)
RDW: 13.2 % (ref 11.7–15.4)
WBC: 7.2 10*3/uL (ref 3.4–10.8)

## 2022-11-01 LAB — CMP14+EGFR
ALT: 21 IU/L (ref 0–32)
AST: 18 IU/L (ref 0–40)
Albumin/Globulin Ratio: 2 (ref 1.2–2.2)
Albumin: 4.6 g/dL (ref 3.9–4.9)
Alkaline Phosphatase: 120 IU/L (ref 44–121)
BUN/Creatinine Ratio: 13 (ref 9–23)
BUN: 11 mg/dL (ref 6–24)
Bilirubin Total: 0.2 mg/dL (ref 0.0–1.2)
CO2: 24 mmol/L (ref 20–29)
Calcium: 9.8 mg/dL (ref 8.7–10.2)
Chloride: 101 mmol/L (ref 96–106)
Creatinine, Ser: 0.84 mg/dL (ref 0.57–1.00)
Globulin, Total: 2.3 g/dL (ref 1.5–4.5)
Glucose: 94 mg/dL (ref 70–99)
Potassium: 3.7 mmol/L (ref 3.5–5.2)
Sodium: 141 mmol/L (ref 134–144)
Total Protein: 6.9 g/dL (ref 6.0–8.5)
eGFR: 85 mL/min/{1.73_m2} (ref >59)

## 2022-11-01 LAB — HIGH SENSITIVITY CRP: CRP, High Sensitivity: 7.29 mg/L — ABNORMAL HIGH (ref 0.00–3.00)

## 2022-11-01 LAB — LIPID PANEL
Chol/HDL Ratio: 4 ratio (ref 0.0–4.4)
Cholesterol, Total: 222 mg/dL — ABNORMAL HIGH (ref 100–199)
HDL: 56 mg/dL (ref >39)
LDL Chol Calc (NIH): 136 mg/dL — ABNORMAL HIGH (ref 0–99)
Triglycerides: 171 mg/dL — ABNORMAL HIGH (ref 0–149)
VLDL Cholesterol Cal: 30 mg/dL (ref 5–40)

## 2022-11-01 LAB — SEDIMENTATION RATE: Sed Rate: 3 mm/hr (ref 0–32)

## 2022-11-05 LAB — TOXASSURE SELECT 13 (MW), URINE

## 2022-11-11 ENCOUNTER — Encounter: Payer: Self-pay | Admitting: Family Medicine

## 2022-11-12 MED ORDER — ROSUVASTATIN CALCIUM 5 MG PO TABS: 5.0000 mg | ORAL_TABLET | Freq: Every day | ORAL | 3 refills | Status: DC

## 2022-12-17 ENCOUNTER — Encounter: Payer: Self-pay | Admitting: Family Medicine

## 2022-12-17 ENCOUNTER — Ambulatory Visit (INDEPENDENT_AMBULATORY_CARE_PROVIDER_SITE_OTHER): Payer: BC Managed Care – PPO | Admitting: Family Medicine

## 2022-12-17 VITALS — BP 123/79 | HR 104 | Ht 62.5 in | Wt 270.0 lb

## 2022-12-17 DIAGNOSIS — M797 Fibromyalgia: Secondary | ICD-10-CM

## 2022-12-17 DIAGNOSIS — F419 Anxiety disorder, unspecified: Secondary | ICD-10-CM | POA: Diagnosis not present

## 2022-12-17 DIAGNOSIS — F320 Major depressive disorder, single episode, mild: Secondary | ICD-10-CM

## 2022-12-17 MED ORDER — CELECOXIB 200 MG PO CAPS: 200.0000 mg | ORAL_CAPSULE | Freq: Two times a day (BID) | ORAL | 3 refills | Status: DC

## 2022-12-17 MED ORDER — DULOXETINE HCL 30 MG PO CPEP: 30.0000 mg | ORAL_CAPSULE | Freq: Every day | ORAL | 2 refills | Status: DC

## 2022-12-17 NOTE — Progress Notes (Signed)
BP 123/79   Pulse (!) 104   Ht 5' 2.5" (1.588 m)   Wt 270 lb (122.5 kg)   SpO2 96%   BMI 48.60 kg/m    Subjective:   Patient ID: Kaitlin Evans, female    DOB: April 21, 1971, 52 y.o.   MRN: [id number]  HPI: Kaitlin Evans is a 52 y.o. female presenting on 12/17/2022 for Medical Management of Chronic Issues and Anxiety   HPI Anxiety depression recheck Patient is coming in today for anxiety depression recheck.  She was started on Cymbalta.  Patient is coming in today for a 2 to 33-monthanxiety recheck.  She feels like her anxiety is doing a lot better.  She denies any suicidal ideations and she feels like her thoughts are not as dark and she is just feeling a lot better.  She says she still gets a lot of aches and pains especially in her neck and her lower back and knees are still bothering her.  She says the Cymbalta does not seem to make a difference and she takes gabapentin and Tylenol and ibuprofen to help with this.    12/17/2022    3:45 PM 10/31/2022   11:41 AM 10/31/2022   11:34 AM 09/18/2022   10:53 AM 02/17/2022    3:12 PM  Depression screen PHQ 2/9  Decreased Interest 0 1 0 0 0  Down, Depressed, Hopeless 0 0 0 0 0  PHQ - 2 Score 0 1 0 0 0  Altered sleeping 1 2 0 0 1  Tired, decreased energy 1 2 0 0 1  Change in appetite 0 0 1 1 0  Feeling bad or failure about yourself  0 1 0 0 0  Trouble concentrating 0 2 0 0 0  Moving slowly or fidgety/restless 0 1 0 0 0  Suicidal thoughts 0 0 0 0 0  PHQ-9 Score '2 9 1 1 2  '$ Difficult doing work/chores Not difficult at all  Not difficult at all Not difficult at all Not difficult at all     Relevant past medical, surgical, family and social history reviewed and updated as indicated. Interim medical history since our last visit reviewed. Allergies and medications reviewed and updated.  Review of Systems  Constitutional:  Negative for chills and fever.  Eyes:  Negative for visual disturbance.  Respiratory:  Negative for chest  tightness and shortness of breath.   Cardiovascular:  Negative for chest pain and leg swelling.  Genitourinary:  Negative for difficulty urinating and dysuria.  Musculoskeletal:  Positive for arthralgias, back pain and neck pain. Negative for gait problem.  Skin:  Negative for rash.  Neurological:  Negative for light-headedness and headaches.  Psychiatric/Behavioral:  Positive for dysphoric mood. Negative for agitation, behavioral problems, self-injury, sleep disturbance and suicidal ideas. The patient is nervous/anxious.   All other systems reviewed and are negative.   Per HPI unless specifically indicated above   Allergies as of 12/17/2022       Reactions   Clindamycin/lincomycin Rash   Latex Rash   Sulfa Antibiotics Nausea And Vomiting, Other (See Comments)   Nausea with severe vomiting Nausea with severe vomiting    Nausea with severe vomiting Nausea with severe vomiting Nausea with severe vomiting Nausea with severe vomiting        Medication List        Accurate as of December 17, 2022  4:04 PM. If you have any questions, ask your nurse or doctor.  albuterol 108 (90 Base) MCG/ACT inhaler Commonly known as: VENTOLIN HFA Inhale 2 puffs into the lungs every 6 (six) hours as needed for wheezing or shortness of breath.   ALPRAZolam 0.5 MG tablet Commonly known as: Xanax Take 0.5-1 tablets (0.25-0.5 mg total) by mouth 2 (two) times daily as needed for anxiety.   celecoxib 200 MG capsule Commonly known as: CELEBREX Take 1 capsule (200 mg total) by mouth 2 (two) times daily. Started by: Worthy Rancher, MD   cyclobenzaprine 10 MG tablet Commonly known as: FLEXERIL Take 1 tablet (10 mg total) by mouth 3 (three) times daily as needed for muscle spasms.   DULoxetine 30 MG capsule Commonly known as: Cymbalta Take 1 capsule (30 mg total) by mouth daily.   fluticasone 50 MCG/ACT nasal spray Commonly known as: FLONASE Place 1 spray into both nostrils 2 (two)  times daily as needed for allergies or rhinitis.   hydrochlorothiazide 25 MG tablet Commonly known as: HYDRODIURIL Take 1 tablet (25 mg total) by mouth daily.   losartan 100 MG tablet Commonly known as: COZAAR Take 1 tablet (100 mg total) by mouth daily.   rosuvastatin 5 MG tablet Commonly known as: Crestor Take 1 tablet (5 mg total) by mouth daily.   triamcinolone cream 0.1 % Commonly known as: KENALOG Apply 1 Application topically 2 (two) times daily.         Objective:   BP 123/79   Pulse (!) 104   Ht 5' 2.5" (1.588 m)   Wt 270 lb (122.5 kg)   SpO2 96%   BMI 48.60 kg/m   Wt Readings from Last 3 Encounters:  12/17/22 270 lb (122.5 kg)  10/31/22 266 lb (120.7 kg)  09/18/22 264 lb 6.4 oz (119.9 kg)    Physical Exam Vitals and nursing note reviewed.  Constitutional:      General: She is not in acute distress.    Appearance: She is well-developed. She is not diaphoretic.  Eyes:     Conjunctiva/sclera: Conjunctivae normal.  Cardiovascular:     Rate and Rhythm: Normal rate and regular rhythm.     Heart sounds: Normal heart sounds. No murmur heard. Pulmonary:     Effort: Pulmonary effort is normal. No respiratory distress.     Breath sounds: Normal breath sounds. No wheezing.  Skin:    General: Skin is warm and dry.     Findings: No rash.  Neurological:     Mental Status: She is alert and oriented to person, place, and time.     Coordination: Coordination normal.  Psychiatric:        Behavior: Behavior normal.       Assessment & Plan:   Problem List Items Addressed This Visit       Other   Anxiety - Primary   Relevant Medications   DULoxetine (CYMBALTA) 30 MG capsule   Depression, major, single episode, mild (HCC)   Relevant Medications   DULoxetine (CYMBALTA) 30 MG capsule   Fibromyalgia   Relevant Medications   celecoxib (CELEBREX) 200 MG capsule   DULoxetine (CYMBALTA) 30 MG capsule    For chronic arthralgias and myalgias, will try  Celebrex instead of the ibuprofen and she can still take the Tylenol and the gabapentin.  Keep Cymbalta at the same because she seems to be doing well. Follow up plan: Return if symptoms worsen or fail to improve, for 2 to 62-monthanxiety recheck.  Counseling provided for all of the vaccine components No orders of the defined  types were placed in this encounter.   Caryl Pina, MD [facility] 12/17/2022, 4:04 PM

## 2022-12-22 ENCOUNTER — Other Ambulatory Visit: Payer: Self-pay | Admitting: Family Medicine

## 2022-12-22 DIAGNOSIS — F320 Major depressive disorder, single episode, mild: Secondary | ICD-10-CM

## 2022-12-22 DIAGNOSIS — F419 Anxiety disorder, unspecified: Secondary | ICD-10-CM

## 2023-01-08 ENCOUNTER — Ambulatory Visit: Payer: BC Managed Care – PPO

## 2023-01-09 ENCOUNTER — Ambulatory Visit (INDEPENDENT_AMBULATORY_CARE_PROVIDER_SITE_OTHER): Payer: BC Managed Care – PPO | Admitting: Nurse Practitioner

## 2023-01-09 VITALS — BP 142/92 | HR 103 | Temp 97.3°F | Resp 20 | Ht 62.0 in | Wt 277.0 lb

## 2023-01-09 DIAGNOSIS — M545 Low back pain, unspecified: Secondary | ICD-10-CM | POA: Diagnosis not present

## 2023-01-09 DIAGNOSIS — R42 Dizziness and giddiness: Secondary | ICD-10-CM

## 2023-01-09 MED ORDER — MECLIZINE HCL 25 MG PO TABS: 25.0000 mg | ORAL_TABLET | Freq: Three times a day (TID) | ORAL | 0 refills | Status: DC | PRN

## 2023-01-09 MED ORDER — KETOROLAC TROMETHAMINE 60 MG/2ML IM SOLN
60.0000 mg | Freq: Once | INTRAMUSCULAR | Status: AC
  Administered 2023-01-09: 60 mg via INTRAMUSCULAR

## 2023-01-09 MED ORDER — METHYLPREDNISOLONE ACETATE 80 MG/ML IJ SUSP
80.0000 mg | Freq: Once | INTRAMUSCULAR | Status: AC
  Administered 2023-01-09: 80 mg via INTRAMUSCULAR

## 2023-01-09 MED ORDER — PREDNISONE 20 MG PO TABS: ORAL_TABLET | ORAL | 0 refills | Status: DC

## 2023-01-09 NOTE — Progress Notes (Signed)
Subjective:    Patient ID: Kaitlin HockMarianne Weill, female    DOB: 02/08/1971, 52 y.o.   MRN: [id number]   Chief Complaint: Back Pain (Numbness in toes/) and Dizziness   Patient here with several complaints: - dizziness- started 2 weeks ago. Is intermittent. Lasts a couple of hours at a time. Rest helps. Has been occurring every day. She has not taken any meds for it.  - Back pain- has had back pain for several years. This is a little different and started several weeks ago.  Back Pain This is a new problem. The current episode started 1 to 4 weeks ago. The problem occurs constantly. The problem has been waxing and waning since onset. The pain is present in the lumbar spine and sacro-iliac. The quality of the pain is described as stabbing. The pain does not radiate. The pain is at a severity of 8/10. The pain is moderate. The pain is The same all the time. The symptoms are aggravated by standing. Pertinent negatives include no abdominal pain, bladder incontinence, bowel incontinence, chest pain, headaches, paresis or weakness. She has tried heat for the symptoms. The treatment provided mild relief.    Patient Active Problem List   Diagnosis Date Noted   Fibromyalgia 08/16/2021   Pain of toe of left foot 04/10/2021   Depression, major, single episode, mild 07/02/2018   Morbid obesity 12/31/2017   Hypertension 07/27/2017   Low back pain 07/27/2017   Anxiety 07/27/2017       Review of Systems  Constitutional:  Negative for diaphoresis.  Eyes:  Negative for pain.  Respiratory:  Negative for shortness of breath.   Cardiovascular:  Negative for chest pain, palpitations and leg swelling.  Gastrointestinal:  Negative for abdominal pain and bowel incontinence.  Endocrine: Negative for polydipsia.  Genitourinary:  Negative for bladder incontinence.  Musculoskeletal:  Positive for back pain.  Skin:  Negative for rash.  Neurological:  Positive for dizziness. Negative for weakness and  headaches.  Hematological:  Does not bruise/bleed easily.  All other systems reviewed and are negative.      Objective:   Physical Exam Constitutional:      Appearance: Normal appearance. She is obese.  HENT:     Right Ear: Tympanic membrane normal.     Left Ear: Tympanic membrane normal.  Cardiovascular:     Rate and Rhythm: Normal rate and regular rhythm.     Heart sounds: Normal heart sounds.  Pulmonary:     Effort: Pulmonary effort is normal.     Breath sounds: Normal breath sounds.  Musculoskeletal:        General: No tenderness.     Comments: Rising slowly from sitting  to standing Gait slow and steady FROM of lumbar spine with pain on flexion, extension and rotation (-) SLR bil Motor strength and sensation distally intact  Skin:    General: Skin is warm.  Neurological:     General: No focal deficit present.     Mental Status: She is alert and oriented to person, place, and time.     Cranial Nerves: No cranial nerve deficit.     Sensory: No sensory deficit.  Psychiatric:        Mood and Affect: Mood normal.        Behavior: Behavior normal.     BP (!) 142/92   Pulse (!) 103   Temp (!) 97.3 F (36.3 C) (Temporal)   Resp 20   Ht 5\' 2"  (1.575 m)   Wt  277 lb (125.6 kg)   SpO2 98%   BMI 50.66 kg/m        Assessment & Plan:   Kaitlin Evans in today with chief complaint of Back Pain (Numbness in toes/) and Dizziness   1. Acute midline low back pain without sciatica Moist heat Rest Hold celebrex while on steroids Can continue flexeril as needed - methylPREDNISolone acetate (DEPO-MEDROL) injection 80 mg - ketorolac (TORADOL) injection 60 mg - predniSONE (DELTASONE) 20 MG tablet; 2 po at sametime daily for 5 days-  Dispense: 10 tablet; Refill: 0  2. Vertigo Force fluids - meclizine (ANTIVERT) 25 MG tablet; Take 1 tablet (25 mg total) by mouth 3 (three) times daily as needed for dizziness.  Dispense: 30 tablet; Refill: 0    The above assessment  and management plan was discussed with the patient. The patient verbalized understanding of and has agreed to the management plan. Patient is aware to call the clinic if symptoms persist or worsen. Patient is aware when to return to the clinic for a follow-up visit. Patient educated on when it is appropriate to go to the emergency department.   Mary-Margaret Daphine Deutscher, FNP

## 2023-01-09 NOTE — Patient Instructions (Signed)
Acute Back Pain, Adult Acute back pain is sudden and usually short-lived. It is often caused by an injury to the muscles and tissues in the back. The injury may result from: A muscle, tendon, or ligament getting overstretched or torn. Ligaments are tissues that connect bones to each other. Lifting something improperly can cause a back strain. Wear and tear (degeneration) of the spinal disks. Spinal disks are circular tissue that provide cushioning between the bones of the spine (vertebrae). Twisting motions, such as while playing sports or doing yard work. A hit to the back. Arthritis. You may have a physical exam, lab tests, and imaging tests to find the cause of your pain. Acute back pain usually goes away with rest and home care. Follow these instructions at home: Managing pain, stiffness, and swelling Take over-the-counter and prescription medicines only as told by your health care provider. Treatment may include medicines for pain and inflammation that are taken by mouth or applied to the skin, or muscle relaxants. Your health care provider may recommend applying ice during the first 24-48 hours after your pain starts. To do this: Put ice in a plastic bag. Place a towel between your skin and the bag. Leave the ice on for 20 minutes, 2-3 times a day. Remove the ice if your skin turns bright red. This is very important. If you cannot feel pain, heat, or cold, you have a greater risk of damage to the area. If directed, apply heat to the affected area as often as told by your health care provider. Use the heat source that your health care provider recommends, such as a moist heat pack or a heating pad. Place a towel between your skin and the heat source. Leave the heat on for 20-30 minutes. Remove the heat if your skin turns bright red. This is especially important if you are unable to feel pain, heat, or cold. You have a greater risk of getting burned. Activity  Do not stay in bed. Staying in  bed for more than 1-2 days can delay your recovery. Sit up and stand up straight. Avoid leaning forward when you sit or hunching over when you stand. If you work at a desk, sit close to it so you do not need to lean over. Keep your chin tucked in. Keep your neck drawn back, and keep your elbows bent at a 90-degree angle (right angle). Sit high and close to the steering wheel when you drive. Add lower back (lumbar) support to your car seat, if needed. Take short walks on even surfaces as soon as you are able. Try to increase the length of time you walk each day. Do not sit, drive, or stand in one place for more than 30 minutes at a time. Sitting or standing for long periods of time can put stress on your back. Do not drive or use heavy machinery while taking prescription pain medicine. Use proper lifting techniques. When you bend and lift, use positions that put less stress on your back: Bend your knees. Keep the load close to your body. Avoid twisting. Exercise regularly as told by your health care provider. Exercising helps your back heal faster and helps prevent back injuries by keeping muscles strong and flexible. Work with a physical therapist to make a safe exercise program, as recommended by your health care provider. Do any exercises as told by your physical therapist. Lifestyle Maintain a healthy weight. Extra weight puts stress on your back and makes it difficult to have good   posture. Avoid activities or situations that make you feel anxious or stressed. Stress and anxiety increase muscle tension and can make back pain worse. Learn ways to manage anxiety and stress, such as through exercise. General instructions Sleep on a firm mattress in a comfortable position. Try lying on your side with your knees slightly bent. If you lie on your back, put a pillow under your knees. Keep your head and neck in a straight line with your spine (neutral position) when using electronic equipment like  smartphones or pads. To do this: Raise your smartphone or pad to look at it instead of bending your head or neck to look down. Put the smartphone or pad at the level of your face while looking at the screen. Follow your treatment plan as told by your health care provider. This may include: Cognitive or behavioral therapy. Acupuncture or massage therapy. Meditation or yoga. Contact a health care provider if: You have pain that is not relieved with rest or medicine. You have increasing pain going down into your legs or buttocks. Your pain does not improve after 2 weeks. You have pain at night. You lose weight without trying. You have a fever or chills. You develop nausea or vomiting. You develop abdominal pain. Get help right away if: You develop new bowel or bladder control problems. You have unusual weakness or numbness in your arms or legs. You feel faint. These symptoms may represent a serious problem that is an emergency. Do not wait to see if the symptoms will go away. Get medical help right away. Call your local emergency services (911 in the [place] Do not drive yourself to the hospital. Summary Acute back pain is sudden and usually short-lived. Use proper lifting techniques. When you bend and lift, use positions that put less stress on your back. Take over-the-counter and prescription medicines only as told by your health care provider, and apply heat or ice as told. This information is not intended to replace advice given to you by your health care provider. Make sure you discuss any questions you have with your health care provider. Document Revised: 12/14/2020 Document Reviewed: 12/14/2020 Elsevier Patient Education  2023 [employer]  

## 2023-01-30 ENCOUNTER — Other Ambulatory Visit: Payer: Self-pay | Admitting: Family Medicine

## 2023-01-30 DIAGNOSIS — F320 Major depressive disorder, single episode, mild: Secondary | ICD-10-CM

## 2023-01-30 DIAGNOSIS — F419 Anxiety disorder, unspecified: Secondary | ICD-10-CM

## 2023-03-10 ENCOUNTER — Other Ambulatory Visit: Payer: Self-pay | Admitting: Family Medicine

## 2023-03-10 DIAGNOSIS — L309 Dermatitis, unspecified: Secondary | ICD-10-CM

## 2023-03-19 ENCOUNTER — Ambulatory Visit (INDEPENDENT_AMBULATORY_CARE_PROVIDER_SITE_OTHER): Payer: BC Managed Care – PPO | Admitting: Family Medicine

## 2023-03-19 ENCOUNTER — Encounter: Payer: Self-pay | Admitting: Family Medicine

## 2023-03-19 VITALS — BP 130/83 | HR 104 | Ht 62.0 in | Wt 284.0 lb

## 2023-03-19 DIAGNOSIS — M545 Low back pain, unspecified: Secondary | ICD-10-CM

## 2023-03-19 MED ORDER — PREDNISONE 20 MG PO TABS
ORAL_TABLET | ORAL | 0 refills | Status: DC
Start: 2023-03-19 — End: 2023-04-07

## 2023-03-19 NOTE — Progress Notes (Signed)
BP 130/83   Pulse (!) 104   Ht 5\' 2"  (1.575 m)   Wt 284 lb (128.8 kg)   SpO2 97%   BMI 51.94 kg/m    Subjective:   Patient ID: Kaitlin Evans, female    DOB: [birth year], 52 y.o.   MRN: [id number]  HPI: Kaitlin Evans is a 52 y.o. female presenting on 03/19/2023 for Back Pain (Lower. )   HPI Continued back pain Patient is coming in for continued back pain.  She was seen for this issue on 01/09/2023.  She was at that time treated with steroids and given a list of exercises to do.  She says the steroids helped momentarily while she was on it but did not get rid of the pain.  She is continued with her low back pain especially worse on the right side with some numbness going down to her foot on the right side.  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  She has been trying exercise and stretching Biofreeze Flexeril at nothing that really helped some but the Flexeril does help her sleep at night but is not improving the overall situation.  She says it makes it hard to walk and exercise and therefore she is gaining a little bit more weight.  Relevant past medical, surgical, family and social history reviewed and updated as indicated. Interim medical history since our last visit reviewed. Allergies and medications reviewed and updated.  Review of Systems  Constitutional:  Negative for chills and fever.  HENT:  Negative for ear pain.   Eyes:  Negative for visual disturbance.  Respiratory:  Negative for chest tightness and shortness of breath.   Cardiovascular:  Negative for chest pain and leg swelling.  Musculoskeletal:  Positive for arthralgias, back pain and myalgias. Negative for gait problem.  Skin:  Negative for rash.  Neurological:  Positive for numbness. Negative for weakness, light-headedness and headaches.  Psychiatric/Behavioral:  Negative for agitation and behavioral problems.   All other systems reviewed and are  negative.   Per HPI unless specifically indicated above   Allergies as of 03/19/2023       Reactions   Clindamycin/lincomycin Rash   Latex Rash   Sulfa Antibiotics Nausea And Vomiting, Other (See Comments)   Nausea with severe vomiting Nausea with severe vomiting    Nausea with severe vomiting Nausea with severe vomiting Nausea with severe vomiting Nausea with severe vomiting        Medication List        Accurate as of March 19, 2023  2:23 PM. If you have any questions, ask your nurse or doctor.          albuterol 108 (90 Base) MCG/ACT inhaler Commonly known as: VENTOLIN HFA Inhale 2 puffs into the lungs every 6 (six) hours as needed for wheezing or shortness of breath.   ALPRAZolam 0.5 MG tablet Commonly known as: Xanax Take 0.5-1 tablets (0.25-0.5 mg total) by mouth 2 (two) times daily as needed for anxiety.   celecoxib 200 MG capsule Commonly known as: CELEBREX Take 1 capsule (200 mg total) by mouth 2 (two) times daily.   cyclobenzaprine 10 MG tablet Commonly known as: FLEXERIL Take 1 tablet (10 mg total) by mouth 3 (three) times daily as needed for muscle spasms.   DULoxetine 30 MG capsule Commonly known as: CYMBALTA TAKE 1 CAPSULE BY MOUTH EVERY DAY   fluticasone 50 MCG/ACT nasal  spray Commonly known as: FLONASE Place 1 spray into both nostrils 2 (two) times daily as needed for allergies or rhinitis.   hydrochlorothiazide 25 MG tablet Commonly known as: HYDRODIURIL Take 1 tablet (25 mg total) by mouth daily.   losartan 100 MG tablet Commonly known as: COZAAR Take 1 tablet (100 mg total) by mouth daily.   meclizine 25 MG tablet Commonly known as: ANTIVERT Take 1 tablet (25 mg total) by mouth 3 (three) times daily as needed for dizziness.   predniSONE 20 MG tablet Commonly known as: DELTASONE 2 po at same time daily for 5 days What changed: additional instructions Changed by: Elige Radon Skye Rodarte, MD   rosuvastatin 5 MG tablet Commonly known  as: Crestor Take 1 tablet (5 mg total) by mouth daily.   triamcinolone cream 0.1 % Commonly known as: KENALOG APPLY TO AFFECTED AREA TWICE A DAY         Objective:   BP 130/83   Pulse (!) 104   Ht 5\' 2"  (1.575 m)   Wt 284 lb (128.8 kg)   SpO2 97%   BMI 51.94 kg/m   Wt Readings from Last 3 Encounters:  03/19/23 284 lb (128.8 kg)  01/09/23 277 lb (125.6 kg)  12/17/22 270 lb (122.5 kg)    Physical Exam Vitals and nursing note reviewed.  Constitutional:      General: She is not in acute distress.    Appearance: She is well-developed. She is not diaphoretic.  Eyes:     Conjunctiva/sclera: Conjunctivae normal.  Musculoskeletal:        General: No tenderness. Normal range of motion.       Back:  Skin:    General: Skin is warm and dry.     Findings: No rash.  Neurological:     Mental Status: She is alert and oriented to person, place, and time.     Coordination: Coordination normal.  Psychiatric:        Behavior: Behavior normal.       Assessment & Plan:   Problem List Items Addressed This Visit       Other   Low back pain - Primary   Relevant Medications   predniSONE (DELTASONE) 20 MG tablet   Other Relevant Orders   MR Lumbar Spine Wo Contrast    Will order MRI, given the short course of prednisone that she can try.  Continue to do exercises and the things that she has been doing. Follow up plan: Return if symptoms worsen or fail to improve.  Counseling provided for all of the vaccine components Orders Placed This Encounter  Procedures   MR Lumbar Spine Wo Contrast    Arville Care, MD [place] [facility] 03/19/2023, 2:23 PM

## 2023-03-26 ENCOUNTER — Encounter (HOSPITAL_BASED_OUTPATIENT_CLINIC_OR_DEPARTMENT_OTHER): Payer: Self-pay

## 2023-03-30 ENCOUNTER — Other Ambulatory Visit: Payer: Self-pay

## 2023-03-30 DIAGNOSIS — M545 Low back pain, unspecified: Secondary | ICD-10-CM

## 2023-04-02 ENCOUNTER — Ambulatory Visit (HOSPITAL_BASED_OUTPATIENT_CLINIC_OR_DEPARTMENT_OTHER): Payer: Self-pay

## 2023-04-07 ENCOUNTER — Encounter: Payer: Self-pay | Admitting: Nurse Practitioner

## 2023-04-07 ENCOUNTER — Ambulatory Visit (INDEPENDENT_AMBULATORY_CARE_PROVIDER_SITE_OTHER): Payer: BC Managed Care – PPO | Admitting: Nurse Practitioner

## 2023-04-07 ENCOUNTER — Other Ambulatory Visit (HOSPITAL_COMMUNITY)
Admission: RE | Admit: 2023-04-07 | Discharge: 2023-04-07 | Disposition: A | Discharge status: Home / Self Care | Payer: BC Managed Care – PPO | Source: Ambulatory Visit | Attending: Nurse Practitioner | Admitting: Nurse Practitioner

## 2023-04-07 VITALS — BP 136/84 | HR 98 | Temp 98.4°F | Ht 62.0 in | Wt 285.0 lb

## 2023-04-07 DIAGNOSIS — L918 Other hypertrophic disorders of the skin: Secondary | ICD-10-CM | POA: Diagnosis not present

## 2023-04-07 NOTE — Progress Notes (Signed)
Established Patient Office Visit  Subjective   Patient ID: Kaitlin Evans, female    DOB: 09-09-1971  Age: 52 y.o. MRN: [id number]  Chief Complaint  Patient presents with   Skin Tag    HPI Kaitlin Evans is a 52 y.o. female present today for a skin tag removal. She discussed it with her PCP at her last visit, and was told to make an appointment " I thought I had time, since I am going on vacation and it has been cutting into my clothes, I just want it removed". There is no personal history of skin cancer. There is no family history of skin cancer. See below for history and description of each lesion.    Patient Active Problem List   Diagnosis Date Noted   Skin tag 04/07/2023   Fibromyalgia 08/16/2021   Pain of toe of left foot 04/10/2021   Depression, major, single episode, mild (HCC) 07/02/2018   Morbid obesity (HCC) 12/31/2017   Hypertension 07/27/2017   Low back pain 07/27/2017   Anxiety 07/27/2017   Past Medical History:  Diagnosis Date   Anxiety    Arthritis    Depression    Hypertension    Sleep apnea    Past Surgical History:  Procedure Laterality Date   ABLATION  2009   CESAREAN SECTION  2005   CESAREAN SECTION  2008   D & C  2004   SALIVARY GLAND SURGERY     stones, had it removed   Social History   Tobacco Use   Smoking status: Every Day    Packs/day: 0.50    Years: 18.00    Additional pack years: 0.00    Total pack years: 9.00    Types: Cigarettes    Last attempt to quit: 04/11/2015    Years since quitting: 7.9   Smokeless tobacco: Never  Vaping Use   Vaping Use: Former  Substance Use Topics   Alcohol use: Yes    Comment: occasional   Drug use: No   Social History   Socioeconomic History   Marital status: Married    Spouse name: Not on file   Number of children: Not on file   Years of education: Not on file   Highest education level: Some college, no degree  Occupational History   Not on file  Tobacco Use   Smoking status: Every  Day    Packs/day: 0.50    Years: 18.00    Additional pack years: 0.00    Total pack years: 9.00    Types: Cigarettes    Last attempt to quit: 04/11/2015    Years since quitting: 7.9   Smokeless tobacco: Never  Vaping Use   Vaping Use: Former  Substance and Sexual Activity   Alcohol use: Yes    Comment: occasional   Drug use: No   Sexual activity: Yes    Birth control/protection: Surgical    Comment: surgery endometrial ablation 2009  Other Topics Concern   Not on file  Social History Narrative   Not on file   Social Determinants of Health   Financial Resource Strain: Low Risk  (01/09/2023)   Overall Financial Resource Strain (CARDIA)    Difficulty of Paying Living Expenses: Not very hard  Food Insecurity: No Food Insecurity (01/09/2023)   Hunger Vital Sign    Worried About Running Out of Food in the Last Year: Never true    Ran Out of Food in the Last Year: Never true  Transportation Needs: No  Transportation Needs (01/09/2023)   PRAPARE - Administrator, Civil Service (Medical): No    Lack of Transportation (Non-Medical): No  Physical Activity: Unknown (01/09/2023)   Exercise Vital Sign    Days of Exercise per Week: 0 days    Minutes of Exercise per Session: Not on file  Stress: No Stress Concern Present (01/09/2023)   [employer] of Occupational Health - Occupational Stress Questionnaire    Feeling of Stress : Only a little  Social Connections: Socially Integrated (01/09/2023)   Social Connection and Isolation Panel [NHANES]    Frequency of Communication with Friends and Family: More than three times a week    Frequency of Social Gatherings with Friends and Family: Once a week    Attends Religious Services: 1 to 4 times per year    Active Member of [employer] or Organizations: Yes    Attends Engineer, structural: 1 to 4 times per year    Marital Status: Married  Catering manager Violence: Not on file   Family Status  Relation Name Status   Mother  Alive    Father  Deceased   Brother  Alive   MGM  Deceased   MGF  Deceased   PGM  Deceased   PGF  Deceased   Family History  Problem Relation Age of Onset   Hypertension Mother    Early death Father    Hypertension Brother    Diabetes Brother    Hypertension Maternal Grandmother    Hypertension Paternal Grandmother    Heart disease Paternal Grandfather    Early death Paternal Grandfather    Allergies  Allergen Reactions   Clindamycin/Lincomycin Rash   Latex Rash   Sulfa Antibiotics Nausea And Vomiting and Other (See Comments)    Nausea with severe vomiting  Nausea with severe vomiting    Nausea with severe vomiting Nausea with severe vomiting Nausea with severe vomiting Nausea with severe vomiting      ROS Negative unless indicated in HPI   Objective:     BP 136/84 Comment: just took med about 30 mins ago KH  Pulse 98   Temp 98.4 F (36.9 C)   Ht 5\' 2"  (1.575 m)   Wt 285 lb (129.3 kg)   SpO2 96%   BMI 52.13 kg/m  BP Readings from Last 3 Encounters:  04/07/23 136/84  03/19/23 130/83  01/09/23 (!) 142/92   Wt Readings from Last 3 Encounters:  04/07/23 285 lb (129.3 kg)  03/19/23 284 lb (128.8 kg)  01/09/23 277 lb (125.6 kg)      Physical Exam Appears well, alert, oriented, pleasant and cooperative. Vitals are as noted. Complete skin exam is performed. Skin tag on upper back,  exam of this area shows normal complete skin exam, no suspicious lesions,  No results found for any visits on 04/07/23.  Last CBC Lab Results  Component Value Date   WBC 7.2 10/31/2022   HGB 14.4 10/31/2022   HCT 43.4 10/31/2022   MCV 87 10/31/2022   MCH 28.7 10/31/2022   RDW 13.2 10/31/2022   PLT 341 10/31/2022   Last metabolic panel Lab Results  Component Value Date   GLUCOSE 94 10/31/2022   NA 141 10/31/2022   K 3.7 10/31/2022   CL 101 10/31/2022   CO2 24 10/31/2022   BUN 11 10/31/2022   CREATININE 0.84 10/31/2022   EGFR 85 10/31/2022   CALCIUM 9.8 10/31/2022    PROT 6.9 10/31/2022   ALBUMIN 4.6 10/31/2022  LABGLOB 2.3 10/31/2022   AGRATIO 2.0 10/31/2022   BILITOT 0.2 10/31/2022   ALKPHOS 120 10/31/2022   AST 18 10/31/2022   ALT 21 10/31/2022   Last lipids Lab Results  Component Value Date   CHOL 222 (H) 10/31/2022   HDL 56 10/31/2022   LDLCALC 136 (H) 10/31/2022   TRIG 171 (H) 10/31/2022   CHOLHDL 4.0 10/31/2022   Last hemoglobin A1c Lab Results  Component Value Date   HGBA1C 5.0 02/11/2019        Assessment & Plan:  Skin tag -     Surgical pathology -     Skin excision  ASSESSMENT: skin tags  PLAN: Excision of skin Informed consent obtained Specimen send for pathology - keep are dry for the next 24 hrs,no winmingb/bath   Sun protection with sunscreens and clothing to prevent skin cancer is discussed. The signs and symptoms of malignant skin lesions are reviewed with her today.   Skin excision  Date/Time: 04/07/2023 11:35 AM  Performed by: Martina Sinner, NP Authorized by: Martina Sinner, NP   Number of Lesions: 1 Lesion 1:    Body area: trunk   Trunk location: back   Initial size (mm): 6 (6 cm skin tag on the bag)   Final defect size (mm): 0 (all removed)   Malignancy: malignancy unknown     Destruction method: scissors used for extraction     Repair type: linear closure   Closure complexity: simple     Repair comments: 6 cm of skin was removed from the mid back. Topical anesthesia, 1% Lidocaine, 1 mg injected. 11 blade was used, with minimal bleeding. The wound was with Silver nitrates. She tolerated the procedure. Specimen will be sent to the lab   Return in about 1 week (around 04/14/2023) for with PCP.    Arrie Aran Santa Lighter, DNP [facility] [address] [place], [place] [address] [phone]

## 2023-04-08 LAB — SURGICAL PATHOLOGY

## 2023-04-12 ENCOUNTER — Other Ambulatory Visit: Payer: Self-pay | Admitting: Family Medicine

## 2023-04-12 DIAGNOSIS — I10 Essential (primary) hypertension: Secondary | ICD-10-CM

## 2023-04-30 ENCOUNTER — Ambulatory Visit: Payer: BC Managed Care – PPO | Admitting: Family Medicine

## 2023-05-05 ENCOUNTER — Other Ambulatory Visit: Payer: Self-pay | Admitting: Family Medicine

## 2023-05-05 DIAGNOSIS — F320 Major depressive disorder, single episode, mild: Secondary | ICD-10-CM

## 2023-05-05 DIAGNOSIS — F419 Anxiety disorder, unspecified: Secondary | ICD-10-CM

## 2023-05-30 ENCOUNTER — Emergency Department (HOSPITAL_BASED_OUTPATIENT_CLINIC_OR_DEPARTMENT_OTHER)
Admission: EM | Admit: 2023-05-30 | Discharge: 2023-05-30 | Disposition: A | Discharge status: Home / Self Care | Payer: BC Managed Care – PPO | Attending: Emergency Medicine | Admitting: Emergency Medicine

## 2023-05-30 ENCOUNTER — Emergency Department (HOSPITAL_BASED_OUTPATIENT_CLINIC_OR_DEPARTMENT_OTHER): Payer: BC Managed Care – PPO | Admitting: Radiology

## 2023-05-30 ENCOUNTER — Other Ambulatory Visit: Payer: Self-pay

## 2023-05-30 DIAGNOSIS — Z91040 Latex allergy status: Secondary | ICD-10-CM | POA: Insufficient documentation

## 2023-05-30 DIAGNOSIS — I10 Essential (primary) hypertension: Secondary | ICD-10-CM | POA: Insufficient documentation

## 2023-05-30 DIAGNOSIS — Z79899 Other long term (current) drug therapy: Secondary | ICD-10-CM | POA: Insufficient documentation

## 2023-05-30 DIAGNOSIS — R42 Dizziness and giddiness: Secondary | ICD-10-CM | POA: Diagnosis present

## 2023-05-30 DIAGNOSIS — G43109 Migraine with aura, not intractable, without status migrainosus: Secondary | ICD-10-CM

## 2023-05-30 LAB — BASIC METABOLIC PANEL
Anion gap: 9 (ref 5–15)
BUN: 11 mg/dL (ref 6–20)
CO2: 28 mmol/L (ref 22–32)
Calcium: 9.2 mg/dL (ref 8.9–10.3)
Chloride: 102 mmol/L (ref 98–111)
Creatinine, Ser: 0.75 mg/dL (ref 0.44–1.00)
GFR, Estimated: >60 mL/min (ref >60)
Glucose, Bld: 119 mg/dL — ABNORMAL HIGH (ref 70–99)
Potassium: 3.3 mmol/L — ABNORMAL LOW (ref 3.5–5.1)
Sodium: 139 mmol/L (ref 135–145)

## 2023-05-30 LAB — CBC
HCT: 42.2 % (ref 36.0–46.0)
Hemoglobin: 14.2 g/dL (ref 12.0–15.0)
MCH: 29.2 pg (ref 26.0–34.0)
MCHC: 33.6 g/dL (ref 30.0–36.0)
MCV: 86.8 fL (ref 80.0–100.0)
Platelets: 311 10*3/uL (ref 150–400)
RBC: 4.86 MIL/uL (ref 3.87–5.11)
RDW: 13.9 % (ref 11.5–15.5)
WBC: 8.2 10*3/uL (ref 4.0–10.5)
nRBC: 0 % (ref 0.0–0.2)

## 2023-05-30 LAB — PREGNANCY, URINE: Preg Test, Ur: NEGATIVE

## 2023-05-30 LAB — TROPONIN I (HIGH SENSITIVITY): Troponin I (High Sensitivity): 3 ng/L (ref <18)

## 2023-05-30 MED ORDER — POTASSIUM CHLORIDE CRYS ER 20 MEQ PO TBCR
40.0000 meq | EXTENDED_RELEASE_TABLET | Freq: Once | ORAL | Status: AC
  Administered 2023-05-30: 40 meq via ORAL
  Filled 2023-05-30: qty 2

## 2023-05-30 MED ORDER — ACETAMINOPHEN 325 MG PO TABS
650.0000 mg | ORAL_TABLET | Freq: Once | ORAL | Status: AC
  Administered 2023-05-30: 650 mg via ORAL
  Filled 2023-05-30: qty 2

## 2023-05-30 NOTE — ED Notes (Signed)
 RN reviewed discharge instructions with pt. Pt verbalized understanding and had no further questions. VSS upon discharge.  

## 2023-05-30 NOTE — ED Provider Notes (Signed)
I saw and evaluated the patient, reviewed the resident's note and I agree with the findings and plan.  EKG Interpretation Date/Time:  Saturday May 30 2023 15:08:11 EDT Ventricular Rate:  100 PR Interval:  158 QRS Duration:  86 QT Interval:  362 QTC Calculation: 466 R Axis:   67  Text Interpretation: Normal sinus rhythm Cannot rule out Anterior infarct , age undetermined Abnormal ECG No previous ECGs available Confirmed by Lorre Nick ([id number]) on 05/30/2023 4:29:59 PM    Patient is EKG per interpretation shows normal sinus rhythm.  Patient here complaint of intermittent dizziness with some mild headache and some tingling.  Patient has not been completely compliant with her blood pressure medication.  On exam here, she has normal neurological function.  Labs here are reassuring with exception of a slightly low potassium.  Potassium was replaced here.  Patient to be discharged   Lorre Nick, MD 05/30/23 1714

## 2023-05-30 NOTE — ED Triage Notes (Signed)
Pt arrived POV with family c/o HTN with s/s progressing throughout the week. Pt c/o headache that started Wednesday, dizziness last night, numbness in lips this morning, "a few episodes of CP" today that she reports was "only while she was upset about having to come to the ED."

## 2023-05-30 NOTE — ED Provider Notes (Signed)
[place] EMERGENCY DEPARTMENT AT [facility] Provider Note   CSN: [id number] Arrival date & time: 05/30/23  1459    History  Chief Complaint  Patient presents with   Dizziness   Kaitlin Evans is a 52 y.o. female with a pertinent past medical history of fibromyalgia, hypertension, migraines, and anxiety who presents with a headache, dizziness, and new numbness of her lips that began last night.  She said that when she was walking in [place] this afternoon she felt lightheaded and dizzy, so she decided to take her blood pressure, which was 150/100.  She also reports some numbness in her lips that began this morning, bilateral blurry vision today, and bilateral lower extremity edema that is worse than her baseline.  She says she took her home blood pressure medications today, including losartan and hydrochlorothiazide.  She also states she has some nausea but says it began after she came to the ED.  She said she had some chest tightness earlier today on the right side that was temporary and she attributes to anxiety, which has resolved.  She denies any fever, shortness of breath, falls, vomiting, diarrhea or bloody stools.   Home Medications Prior to Admission medications   Medication Sig Start Date End Date Taking? Authorizing Provider  albuterol (VENTOLIN HFA) 108 (90 Base) MCG/ACT inhaler Inhale 2 puffs into the lungs every 6 (six) hours as needed for wheezing or shortness of breath. 02/17/22   [name], Elige Radon, MD  ALPRAZolam Prudy Feeler) 0.5 MG tablet Take 0.5-1 tablets (0.25-0.5 mg total) by mouth 2 (two) times daily as needed for anxiety. 10/31/22   [name], Elige Radon, MD  celecoxib (CELEBREX) 200 MG capsule Take 1 capsule (200 mg total) by mouth 2 (two) times daily. 12/17/22   [name], Elige Radon, MD  cyclobenzaprine (FLEXERIL) 10 MG tablet Take 1 tablet (10 mg total) by mouth 3 (three) times daily as needed for muscle spasms. 02/17/22   [name], Elige Radon, MD  DULoxetine  (CYMBALTA) 30 MG capsule Take 1 capsule (30 mg total) by mouth daily. **NEEDS TO BE SEEN BEFORE NEXT REFILL** 05/05/23   [name], Elige Radon, MD  fluticasone (FLONASE) 50 MCG/ACT nasal spray Place 1 spray into both nostrils 2 (two) times daily as needed for allergies or rhinitis. 02/17/22   [name], Elige Radon, MD  hydrochlorothiazide (HYDRODIURIL) 25 MG tablet Take 1 tablet (25 mg total) by mouth daily. 10/31/22   [name], Elige Radon, MD  losartan (COZAAR) 100 MG tablet TAKE 1 TABLET BY MOUTH EVERY DAY 04/13/23   [name], Elige Radon, MD  meclizine (ANTIVERT) 25 MG tablet Take 1 tablet (25 mg total) by mouth 3 (three) times daily as needed for dizziness. 01/09/23   Daphine Deutscher, Mary-Margaret, FNP  rosuvastatin (CRESTOR) 5 MG tablet Take 1 tablet (5 mg total) by mouth daily. 11/12/22   [name], Elige Radon, MD  triamcinolone cream (KENALOG) 0.1 % APPLY TO AFFECTED AREA TWICE A DAY 03/11/23   [name], Elige Radon, MD      Allergies    Clindamycin/lincomycin, Latex, and Sulfa antibiotics    Review of Systems   Review of Systems  Constitutional: Negative.   HENT: Negative.    Eyes:  Positive for visual disturbance.       Blurry vision today  Respiratory: Negative.    Cardiovascular:  Positive for leg swelling.  Gastrointestinal:  Positive for nausea. Negative for abdominal pain, blood in stool, diarrhea and vomiting.  Endocrine: Negative.   Genitourinary: Negative.   Musculoskeletal: Negative.   Skin:  Negative.   Allergic/Immunologic: Negative.   Neurological:  Positive for dizziness, light-headedness, numbness and headaches.  Hematological: Negative.   Psychiatric/Behavioral:  The patient is nervous/anxious.     Physical Exam Updated Vital Signs BP 129/82   Pulse 78   Temp 98.2 F (36.8 C) (Oral)   Resp 17   SpO2 100%  Physical Exam HENT:     Head: Normocephalic and atraumatic.     Nose: Nose normal.     Mouth/Throat:     Mouth: Mucous membranes are moist.  Eyes:     Extraocular Movements:  Extraocular movements intact.     Pupils: Pupils are equal, round, and reactive to light.  Cardiovascular:     Rate and Rhythm: Normal rate and regular rhythm.     Pulses: Normal pulses.     Heart sounds: Normal heart sounds.  Pulmonary:     Effort: Pulmonary effort is normal.     Breath sounds: Normal breath sounds.  Abdominal:     General: Bowel sounds are normal.     Palpations: Abdomen is soft.     Tenderness: There is no abdominal tenderness.  Musculoskeletal:     Cervical back: Normal range of motion and neck supple.     Right lower leg: Edema present.     Left lower leg: Edema present.     Comments: 2+ LE pitting edema on right, 1+ on the left   Skin:    General: Skin is warm.  Neurological:     General: No focal deficit present.     Mental Status: She is alert and oriented to person, place, and time.     Cranial Nerves: No cranial nerve deficit.     Motor: No weakness.     ED Results / Procedures / Treatments   Labs (all labs ordered are listed, but only abnormal results are displayed) Labs Reviewed  BASIC METABOLIC PANEL - Abnormal; Notable for the following components:      Result Value   Potassium 3.3 (*)    Glucose, Bld 119 (*)    All other components within normal limits  CBC  PREGNANCY, URINE  TROPONIN I (HIGH SENSITIVITY)  TROPONIN I (HIGH SENSITIVITY)   EKG EKG Interpretation Date/Time:  Saturday May 30 2023 15:08:11 EDT Ventricular Rate:  100 PR Interval:  158 QRS Duration:  86 QT Interval:  362 QTC Calculation: 466 R Axis:   67  Text Interpretation: Normal sinus rhythm Cannot rule out Anterior infarct , age undetermined Abnormal ECG No previous ECGs available Confirmed by Lorre Nick ([id number]) on 05/30/2023 4:29:59 PM  Radiology DG Chest 2 View  Result Date: 05/30/2023 CLINICAL DATA:  Chest pain. EXAM: CHEST - 2 VIEW COMPARISON:  None Available. FINDINGS: The heart size and mediastinal contours are within normal limits. Both lungs are  clear. The visualized skeletal structures are unremarkable. IMPRESSION: Negative two view chest x-ray Electronically Signed   By: Marin Roberts M.D.   On: 05/30/2023 16:21    Procedures Procedures    Medications Ordered in ED Medications  potassium chloride SA (KLOR-CON M) CR tablet 40 mEq (40 mEq Oral Given 05/30/23 1707)  acetaminophen (TYLENOL) tablet 650 mg (650 mg Oral Given 05/30/23 1707)    ED Course/ Medical Decision Making/ A&P           HEART Score: 3                    Medical Decision Making Amount and/or Complexity of Data Reviewed  Labs: ordered. Radiology: ordered.  Risk OTC drugs. Prescription drug management.   Hadja Cieslik is a 52 y.o. female with a pertinent past medical history of fibromyalgia, hypertension, headaches, and anxiety who presents with a headache, dizziness, and new numbness of her lips that began last night.  Differential includes symptomatic hypertension vs migraine with aura vs heart failure vs central venous sinus thrombosis.   Ordered an EKG and troponins to rule out ACS given chest pain earlier today.  Troponin was 3.  EKG showed a normal sinus rhythm. HEART score was 3. Low concern for ACS at this time.  Considered new onset heart failure given worsening lower extremity edema. Chest x-ray was obtained, which I interpret to be grossly normal. She does not have any new murmurs, dyspnea, or orthopnea, so HF is less likely. Also considered central venous sinus thrombosis, however patient has not had neurological symptoms other than transient lip numbness, which has resolved.   Ordered a CBC which was unremarkable.  BMP was unremarkable other than potassium of 3.3, likely in the setting of her diuretic use, which I repleted with 40 mEq oral potassium. Pregnancy test was negative.   On repeat vitals a few hours later, her blood pressure normalized to 120/70 and her symptoms improved.  Given the above labs and findings, as well as the  improvement in the patient's symptoms, I think this is most likely a migraine. She says this headache feels similar to prior migraines she's had. She was given tylenol, which improved her symptoms.   From my perspective, she is medically stable for discharge. I counseled her that if she continues to experience a worsening headache and dizziness over the next couple days, or if she has new chest pain, persistent nausea and vomiting, vision changes or loss, or shortness of breath, she should return to the emergency department.  Otherwise, I recommend following up with her primary care provider in the next 2-4 weeks.  Final Clinical Impression(s) / ED Diagnoses Final diagnoses:  Migraine with aura and without status migrainosus, not intractable     Annett Fabian, MD 05/30/23 Garnette Scheuermann    Lorre Nick, MD 05/31/23 [phone]

## 2023-05-30 NOTE — Discharge Instructions (Addendum)
You came to the emergency department because you had an elevated blood pressure, headache, blurry vision, chest pain, and lip numbness.    Given your chest pain, we ordered some cardiac tests including an EKG and cardiac enzymes, which were normal.    We took an x-ray of your lungs and heart, which was also normal.    We ordered some blood tests, including a complete blood count and a complete metabolic panel, which showed that your potassium was a little low, so we gave you some potassium.   During your stay in the emergency department, your blood pressure normalized.  Given the above findings and the improvement of your symptoms, we think that this is most likely a migraine and you are medically stable for discharge.   If you continue to experience a worsening headache and dizziness over the next couple days, or if you start to experience chest pain, persistent nausea and vomiting, vision changes or loss, or shortness of breath, please return to the emergency department.  Otherwise, we recommend following up with your primary care provider in the next 2-4 weeks.

## 2023-05-30 NOTE — ED Notes (Signed)
Patient transported to X-ray 

## 2023-06-07 ENCOUNTER — Other Ambulatory Visit: Payer: Self-pay | Admitting: Family Medicine

## 2023-06-07 DIAGNOSIS — F419 Anxiety disorder, unspecified: Secondary | ICD-10-CM

## 2023-06-07 DIAGNOSIS — F320 Major depressive disorder, single episode, mild: Secondary | ICD-10-CM

## 2023-06-09 MED ORDER — DULOXETINE HCL 30 MG PO CPEP
30.0000 mg | ORAL_CAPSULE | Freq: Every day | ORAL | 0 refills | Status: DC
Start: 2023-06-09 — End: 2023-06-15

## 2023-06-09 NOTE — Addendum Note (Signed)
Addended by: Julious Payer D on: 06/09/2023 10:40 AM   Modules accepted: Orders

## 2023-06-09 NOTE — Telephone Encounter (Signed)
Pt has apt 06/15/2023

## 2023-06-09 NOTE — Telephone Encounter (Signed)
[name] pt NTBS 30-d given 05/05/23

## 2023-06-15 ENCOUNTER — Ambulatory Visit: Payer: BC Managed Care – PPO | Admitting: Family Medicine

## 2023-06-15 ENCOUNTER — Encounter: Payer: Self-pay | Admitting: Family Medicine

## 2023-06-15 VITALS — BP 123/88 | HR 93 | Ht 62.0 in | Wt 284.0 lb

## 2023-06-15 DIAGNOSIS — F320 Major depressive disorder, single episode, mild: Secondary | ICD-10-CM | POA: Diagnosis not present

## 2023-06-15 DIAGNOSIS — E876 Hypokalemia: Secondary | ICD-10-CM | POA: Diagnosis not present

## 2023-06-15 DIAGNOSIS — F419 Anxiety disorder, unspecified: Secondary | ICD-10-CM

## 2023-06-15 DIAGNOSIS — I10 Essential (primary) hypertension: Secondary | ICD-10-CM

## 2023-06-15 DIAGNOSIS — R42 Dizziness and giddiness: Secondary | ICD-10-CM

## 2023-06-15 MED ORDER — DULOXETINE HCL 30 MG PO CPEP
30.0000 mg | ORAL_CAPSULE | Freq: Every day | ORAL | 3 refills | Status: DC
Start: 2023-06-15 — End: 2023-11-19

## 2023-06-15 MED ORDER — LOSARTAN POTASSIUM 100 MG PO TABS
100.0000 mg | ORAL_TABLET | Freq: Every day | ORAL | 3 refills | Status: DC
Start: 2023-06-15 — End: 2024-02-19

## 2023-06-15 MED ORDER — ALPRAZOLAM 0.5 MG PO TABS: 0.2500 mg | ORAL_TABLET | Freq: Two times a day (BID) | ORAL | 2 refills | Status: DC | PRN

## 2023-06-15 NOTE — Progress Notes (Signed)
BP 123/88   Pulse 93   Ht 5\' 2"  (1.575 m)   Wt 284 lb (128.8 kg)   SpO2 98%   BMI 51.94 kg/m    Subjective:   Patient ID: Kaitlin Evans, female    DOB: 03/10/1971, 52 y.o.   MRN: [id number]  HPI: Kaitlin Evans is a 52 y.o. female presenting on 06/15/2023 for ER Follow up, Hypertension, and Dizziness (Low potassium/)   HPI ER follow-up Patient is coming in today for ER follow-up.  She is seen in the emergency department on 05/30/2023 for migraine and hypertension and hypokalemia and dizziness.  She was feeling lightheaded and dizzy and then took her blood pressure at [facility] that was 150/100. Still has a small headache and has no further tingling in her lips. She denies any further dizziness.  Blood pressure looks good today because she did restart taking her blood pressure medicines consistently.  She is feeling a lot better, she still has a little bit of achiness from her fibromyalgia but that is feeling pretty good today.  She still has small headaches but nothing like what she had on that day.  Anxiety recheck  Current rx-alprazolam 0.25 to 0.5 mg twice daily as needed # meds rx-20 Effectiveness of current meds-works well, only filled 120 pill prescription in the past 6 months Adverse reactions form meds-none Pill count performed-No Last drug screen -11/11/2022 ( high risk q64m, moderate risk q90m, low risk yearly ) Urine drug screen today- No Was the NCCSR reviewed-yes  If yes were their any concerning findings? -None Controlled substance contract signed on: 624  Relevant past medical, surgical, family and social history reviewed and updated as indicated. Interim medical history since our last visit reviewed. Allergies and medications reviewed and updated.  Review of Systems  Constitutional:  Negative for chills and fever.  Eyes:  Negative for visual disturbance.  Respiratory:  Negative for chest tightness and shortness of breath.   Cardiovascular:  Negative for chest  pain and leg swelling.  Gastrointestinal:  Negative for abdominal pain.  Genitourinary:  Negative for difficulty urinating and dysuria.  Musculoskeletal:  Positive for myalgias.  Skin:  Negative for rash.  Neurological:  Positive for headaches. Negative for dizziness and light-headedness.  Psychiatric/Behavioral:  Negative for agitation and behavioral problems.   All other systems reviewed and are negative.   Per HPI unless specifically indicated above   Allergies as of 06/15/2023       Reactions   Clindamycin/lincomycin Rash   Latex Rash   Sulfa Antibiotics Nausea And Vomiting, Other (See Comments)   Nausea with severe vomiting Nausea with severe vomiting    Nausea with severe vomiting Nausea with severe vomiting Nausea with severe vomiting Nausea with severe vomiting        Medication List        Accurate as of June 15, 2023  9:30 AM. If you have any questions, ask your nurse or doctor.          STOP taking these medications    meclizine 25 MG tablet Commonly known as: ANTIVERT Stopped by: Elige Radon Wali Reinheimer       TAKE these medications    albuterol 108 (90 Base) MCG/ACT inhaler Commonly known as: VENTOLIN HFA Inhale 2 puffs into the lungs every 6 (six) hours as needed for wheezing or shortness of breath.   ALPRAZolam 0.5 MG tablet Commonly known as: Xanax Take 0.5-1 tablets (0.25-0.5 mg total) by mouth 2 (two) times daily as needed for anxiety.  celecoxib 200 MG capsule Commonly known as: CELEBREX Take 1 capsule (200 mg total) by mouth 2 (two) times daily.   cyclobenzaprine 10 MG tablet Commonly known as: FLEXERIL Take 1 tablet (10 mg total) by mouth 3 (three) times daily as needed for muscle spasms.   DULoxetine 30 MG capsule Commonly known as: CYMBALTA Take 1 capsule (30 mg total) by mouth daily.   fluticasone 50 MCG/ACT nasal spray Commonly known as: FLONASE Place 1 spray into both nostrils 2 (two) times daily as needed for allergies or  rhinitis.   hydrochlorothiazide 25 MG tablet Commonly known as: HYDRODIURIL Take 1 tablet (25 mg total) by mouth daily.   losartan 100 MG tablet Commonly known as: COZAAR Take 1 tablet (100 mg total) by mouth daily.   rosuvastatin 5 MG tablet Commonly known as: Crestor Take 1 tablet (5 mg total) by mouth daily.   triamcinolone cream 0.1 % Commonly known as: KENALOG APPLY TO AFFECTED AREA TWICE A DAY         Objective:   BP 123/88   Pulse 93   Ht 5\' 2"  (1.575 m)   Wt 284 lb (128.8 kg)   SpO2 98%   BMI 51.94 kg/m   Wt Readings from Last 3 Encounters:  06/15/23 284 lb (128.8 kg)  04/07/23 285 lb (129.3 kg)  03/19/23 284 lb (128.8 kg)    Physical Exam Vitals and nursing note reviewed.  Constitutional:      General: She is not in acute distress.    Appearance: She is well-developed. She is not diaphoretic.  Eyes:     Conjunctiva/sclera: Conjunctivae normal.  Cardiovascular:     Rate and Rhythm: Normal rate and regular rhythm.     Heart sounds: Normal heart sounds. No murmur heard. Pulmonary:     Effort: Pulmonary effort is normal. No respiratory distress.     Breath sounds: Normal breath sounds. No wheezing.  Abdominal:     General: Abdomen is flat. Bowel sounds are normal. There is no distension.     Tenderness: There is no abdominal tenderness. There is no guarding or rebound.  Musculoskeletal:        General: No swelling or tenderness. Normal range of motion.  Skin:    General: Skin is warm and dry.     Findings: No rash.  Neurological:     Mental Status: She is alert and oriented to person, place, and time.     Coordination: Coordination normal.  Psychiatric:        Behavior: Behavior normal.       Assessment & Plan:   Problem List Items Addressed This Visit       Cardiovascular and Mediastinum   Hypertension   Relevant Medications   losartan (COZAAR) 100 MG tablet     Other   Anxiety   Relevant Medications   ALPRAZolam (XANAX) 0.5 MG  tablet   DULoxetine (CYMBALTA) 30 MG capsule   Depression, major, single episode, mild (HCC)   Relevant Medications   ALPRAZolam (XANAX) 0.5 MG tablet   DULoxetine (CYMBALTA) 30 MG capsule   Other Visit Diagnoses     Hypokalemia    -  Primary   Relevant Orders   CMP14+EGFR   Essential hypertension       Relevant Medications   losartan (COZAAR) 100 MG tablet   Dizziness       Relevant Orders   CBC with Differential/Platelet       Recheck potassium today.  Will see how her  levels are doing.  Continue other blood pressure medicine.  Continue with her anxiety and fibromyalgia medicine including Cymbalta and alprazolam. Follow up plan: Return in about 6 months (around 12/13/2023), or if symptoms worsen or fail to improve, for Anxiety recheck.  Counseling provided for all of the vaccine components Orders Placed This Encounter  Procedures   CMP14+EGFR   CBC with Differential/Platelet    Arville Care, MD [facility] 06/15/2023, 9:30 AM

## 2023-06-16 LAB — CMP14+EGFR
ALT: 28 IU/L (ref 0–32)
AST: 19 IU/L (ref 0–40)
Albumin: 4.1 g/dL (ref 3.9–4.9)
Alkaline Phosphatase: 126 IU/L — ABNORMAL HIGH (ref 44–121)
BUN/Creatinine Ratio: 12 (ref 9–23)
BUN: 10 mg/dL (ref 6–24)
Bilirubin Total: 0.5 mg/dL (ref 0.0–1.2)
CO2: 25 mmol/L (ref 20–29)
Calcium: 9.6 mg/dL (ref 8.7–10.2)
Chloride: 100 mmol/L (ref 96–106)
Creatinine, Ser: 0.81 mg/dL (ref 0.57–1.00)
Globulin, Total: 2.7 g/dL (ref 1.5–4.5)
Glucose: 122 mg/dL — ABNORMAL HIGH (ref 70–99)
Potassium: 4 mmol/L (ref 3.5–5.2)
Sodium: 140 mmol/L (ref 134–144)
Total Protein: 6.8 g/dL (ref 6.0–8.5)
eGFR: 89 mL/min/{1.73_m2} (ref >59)

## 2023-06-16 LAB — CBC WITH DIFFERENTIAL/PLATELET
Basophils Absolute: 0 10*3/uL (ref 0.0–0.2)
Basos: 1 %
EOS (ABSOLUTE): 0.1 10*3/uL (ref 0.0–0.4)
Eos: 2 %
Hematocrit: 43.7 % (ref 34.0–46.6)
Hemoglobin: 14.4 g/dL (ref 11.1–15.9)
Immature Grans (Abs): 0 10*3/uL (ref 0.0–0.1)
Immature Granulocytes: 0 %
Lymphocytes Absolute: 2.9 10*3/uL (ref 0.7–3.1)
Lymphs: 41 %
MCH: 28.6 pg (ref 26.6–33.0)
MCHC: 33 g/dL (ref 31.5–35.7)
MCV: 87 fL (ref 79–97)
Monocytes Absolute: 0.5 10*3/uL (ref 0.1–0.9)
Monocytes: 8 %
Neutrophils Absolute: 3.6 10*3/uL (ref 1.4–7.0)
Neutrophils: 48 %
Platelets: 319 10*3/uL (ref 150–450)
RBC: 5.04 x10E6/uL (ref 3.77–5.28)
RDW: 13.1 % (ref 11.7–15.4)
WBC: 7.2 10*3/uL (ref 3.4–10.8)

## 2023-06-19 ENCOUNTER — Other Ambulatory Visit: Payer: Self-pay

## 2023-06-19 DIAGNOSIS — R739 Hyperglycemia, unspecified: Secondary | ICD-10-CM

## 2023-06-23 LAB — HGB A1C W/O EAG: Hgb A1c MFr Bld: 6.4 % — ABNORMAL HIGH (ref 4.8–5.6)

## 2023-06-23 LAB — SPECIMEN STATUS REPORT

## 2023-08-07 ENCOUNTER — Ambulatory Visit: Payer: BC Managed Care – PPO | Admitting: Family Medicine

## 2023-08-07 ENCOUNTER — Encounter: Payer: Self-pay | Admitting: Family Medicine

## 2023-08-07 VITALS — BP 112/75 | HR 100 | Temp 98.3°F | Ht 62.0 in | Wt 283.0 lb

## 2023-08-07 DIAGNOSIS — M797 Fibromyalgia: Secondary | ICD-10-CM | POA: Diagnosis not present

## 2023-08-07 DIAGNOSIS — H539 Unspecified visual disturbance: Secondary | ICD-10-CM | POA: Diagnosis not present

## 2023-08-07 DIAGNOSIS — R7303 Prediabetes: Secondary | ICD-10-CM | POA: Diagnosis not present

## 2023-08-07 DIAGNOSIS — R42 Dizziness and giddiness: Secondary | ICD-10-CM

## 2023-08-07 MED ORDER — METFORMIN HCL 500 MG PO TABS
500.0000 mg | ORAL_TABLET | Freq: Two times a day (BID) | ORAL | 0 refills | Status: DC
Start: 2023-08-07 — End: 2023-09-23

## 2023-08-07 MED ORDER — GABAPENTIN 300 MG PO CAPS
300.0000 mg | ORAL_CAPSULE | Freq: Every day | ORAL | 0 refills | Status: DC | PRN
Start: 2023-08-07 — End: 2023-09-23

## 2023-08-07 NOTE — Progress Notes (Signed)
Subjective:  Patient ID: Kaitlin Evans, female    DOB: 1970/11/16, 52 y.o.   MRN: [id number]  Patient Care Team: [name], Elige Radon, MD as PCP - General (Family Medicine)   Chief Complaint:  neuropathy, dizziness, blurred vision (Patient is concerned that she is feeling ill side effects of diabetes)   HPI: Kaitlin Evans is a 52 y.o. female presenting on 08/07/2023 for neuropathy, dizziness, blurred vision (Patient is concerned that she is feeling ill side effects of diabetes)  Patient was recently diagnosed with Prediabetes on 06/15/23. She was instructed by PCP to work on diet and lifestyle to control A1C. She is monitoring her BG at home.  States that she was treated 20 years ago for "insulin resistance"  Reports symptoms of neuropathy in her hands and feet. Describes pain that is not improved with wearing compression socks. Reports dizziness, increased thirst, and has noticed a "significant change in eyesight". Feels that she is not getting better, but worse. She is checking her BG in the am and before meals. Fasting AM 130-160. States that she does not want diabetes and would like to address it before it worsens. Reports weight gain this past year. States that she is moving around a lot at work and does not want to have an event there.  States that she was treated for PCOS in [place]. States that she was on metformin previously, has been off of it for ~17 years. States that she was not able to get pregnant unless she was taking metformin. Reports hirsutism as well.  States that vision is blurrier and has a "line like with astigmatism". States that she has lost far vision and she struggles to transition from computer to paper.  States that her brother was recently diagnosed with T2DM as well.  Reports that she was previously on gabapentin for fibromyalgia pain and needs a refill. States that she takes it only when pain is really bothering her. Has been taking less than one per  day. Taking it a few times per week.   Relevant past medical, surgical, family, and social history reviewed and updated as indicated.  Allergies and medications reviewed and updated. Data reviewed: Chart in Epic.  Past Medical History:  Diagnosis Date   Anxiety    Arthritis    Depression    Hypertension    Sleep apnea     Past Surgical History:  Procedure Laterality Date   ABLATION  2009   CESAREAN SECTION  2005   CESAREAN SECTION  2008   D & C  2004   SALIVARY GLAND SURGERY     stones, had it removed    Social History   Socioeconomic History   Marital status: Married    Spouse name: Not on file   Number of children: Not on file   Years of education: Not on file   Highest education level: Some college, no degree  Occupational History   Not on file  Tobacco Use   Smoking status: Every Day    Current packs/day: 0.00    Average packs/day: 0.5 packs/day for 18.0 years (9.0 ttl pk-yrs)    Types: Cigarettes    Start date: 04/10/1997    Last attempt to quit: 04/11/2015    Years since quitting: 8.3   Smokeless tobacco: Never  Vaping Use   Vaping status: Former  Substance and Sexual Activity   Alcohol use: Yes    Comment: occasional   Drug use: No   Sexual activity:  Yes    Birth control/protection: Surgical    Comment: surgery endometrial ablation 2009  Other Topics Concern   Not on file  Social History Narrative   Not on file   Social Determinants of Health   Financial Resource Strain: Low Risk  (01/09/2023)   Overall Financial Resource Strain (CARDIA)    Difficulty of Paying Living Expenses: Not very hard  Food Insecurity: No Food Insecurity (01/09/2023)   Hunger Vital Sign    Worried About Running Out of Food in the Last Year: Never true    Ran Out of Food in the Last Year: Never true  Transportation Needs: No Transportation Needs (01/09/2023)   PRAPARE - Administrator, Civil Service (Medical): No    Lack of Transportation (Non-Medical): No  Physical  Activity: Unknown (01/09/2023)   Exercise Vital Sign    Days of Exercise per Week: 0 days    Minutes of Exercise per Session: Not on file  Stress: No Stress Concern Present (01/09/2023)   [employer] of Occupational Health - Occupational Stress Questionnaire    Feeling of Stress : Only a little  Social Connections: Socially Integrated (01/09/2023)   Social Connection and Isolation Panel [NHANES]    Frequency of Communication with Friends and Family: More than three times a week    Frequency of Social Gatherings with Friends and Family: Once a week    Attends Religious Services: 1 to 4 times per year    Active Member of [employer] or Organizations: Yes    Attends Banker Meetings: 1 to 4 times per year    Marital Status: Married  Catering manager Violence: Unknown (01/10/2022)   Received from [employer], [facility]   HITS    Physically Hurt: Not on file    Insult or Talk Down To: Not on file    Threaten Physical Harm: Not on file    Scream or Curse: Not on file    Outpatient Encounter Medications as of 08/07/2023  Medication Sig   albuterol (VENTOLIN HFA) 108 (90 Base) MCG/ACT inhaler Inhale 2 puffs into the lungs every 6 (six) hours as needed for wheezing or shortness of breath.   ALPRAZolam (XANAX) 0.5 MG tablet Take 0.5-1 tablets (0.25-0.5 mg total) by mouth 2 (two) times daily as needed for anxiety.   celecoxib (CELEBREX) 200 MG capsule Take 1 capsule (200 mg total) by mouth 2 (two) times daily.   cyclobenzaprine (FLEXERIL) 10 MG tablet Take 1 tablet (10 mg total) by mouth 3 (three) times daily as needed for muscle spasms.   DULoxetine (CYMBALTA) 30 MG capsule Take 1 capsule (30 mg total) by mouth daily.   fluticasone (FLONASE) 50 MCG/ACT nasal spray Place 1 spray into both nostrils 2 (two) times daily as needed for allergies or rhinitis.   hydrochlorothiazide (HYDRODIURIL) 25 MG tablet Take 1 tablet (25 mg total) by mouth daily.   losartan (COZAAR) 100 MG tablet Take 1  tablet (100 mg total) by mouth daily.   rosuvastatin (CRESTOR) 5 MG tablet Take 1 tablet (5 mg total) by mouth daily.   triamcinolone cream (KENALOG) 0.1 % APPLY TO AFFECTED AREA TWICE A DAY   No facility-administered encounter medications on file as of 08/07/2023.    Allergies  Allergen Reactions   Clindamycin/Lincomycin Rash   Latex Rash   Sulfa Antibiotics Nausea And Vomiting and Other (See Comments)    Nausea with severe vomiting  Nausea with severe vomiting    Nausea with severe vomiting Nausea  with severe vomiting Nausea with severe vomiting Nausea with severe vomiting    Review of Systems As per HPI Objective:  BP 112/75   Pulse 100   Temp 98.3 F (36.8 C)   Ht 5\' 2"  (1.575 m)   Wt 283 lb (128.4 kg)   SpO2 96%   BMI 51.76 kg/m    Wt Readings from Last 3 Encounters:  08/07/23 283 lb (128.4 kg)  06/15/23 284 lb (128.8 kg)  04/07/23 285 lb (129.3 kg)    Physical Exam Constitutional:      General: She is awake. She is not in acute distress.    Appearance: Normal appearance. She is well-developed and well-groomed. She is morbidly obese. She is not ill-appearing, toxic-appearing or diaphoretic.  Eyes:     Extraocular Movements:     Right eye: Normal extraocular motion and no nystagmus.     Left eye: Normal extraocular motion and no nystagmus.     Pupils: Pupils are equal, round, and reactive to light. Pupils are equal.     Funduscopic exam:    Right eye: Red reflex present.        Left eye: Red reflex present. Cardiovascular:     Rate and Rhythm: Normal rate and regular rhythm.     Pulses: Normal pulses.          Radial pulses are 2+ on the right side and 2+ on the left side.       Posterior tibial pulses are 2+ on the right side and 2+ on the left side.     Heart sounds: Normal heart sounds. No murmur heard.    No gallop.  Pulmonary:     Effort: Pulmonary effort is normal. No respiratory distress.     Breath sounds: Normal breath sounds. No stridor. No  wheezing, rhonchi or rales.  Musculoskeletal:     Cervical back: Full passive range of motion without pain and neck supple.     Right lower leg: No edema.     Left lower leg: No edema.  Skin:    General: Skin is warm.     Capillary Refill: Capillary refill takes less than 2 seconds.  Neurological:     General: No focal deficit present.     Mental Status: She is alert, oriented to person, place, and time and easily aroused. Mental status is at baseline.     GCS: GCS eye subscore is 4. GCS verbal subscore is 5. GCS motor subscore is 6.     Motor: No weakness.  Psychiatric:        Attention and Perception: Attention and perception normal.        Mood and Affect: Mood and affect normal.        Speech: Speech normal.        Behavior: Behavior normal. Behavior is cooperative.        Thought Content: Thought content normal. Thought content does not include homicidal or suicidal ideation. Thought content does not include homicidal or suicidal plan.        Cognition and Memory: Cognition and memory normal.        Judgment: Judgment normal.     Results for orders placed or performed in visit on 06/15/23  CMP14+EGFR  Result Value Ref Range   Glucose 122 (H) 70 - 99 mg/dL   BUN 10 6 - 24 mg/dL   Creatinine, Ser 4.40 0.57 - 1.00 mg/dL   eGFR 89 >10 UV/OZD/6.64   BUN/Creatinine Ratio 12 9 -  23   Sodium 140 134 - 144 mmol/L   Potassium 4.0 3.5 - 5.2 mmol/L   Chloride 100 96 - 106 mmol/L   CO2 25 20 - 29 mmol/L   Calcium 9.6 8.7 - 10.2 mg/dL   Total Protein 6.8 6.0 - 8.5 g/dL   Albumin 4.1 3.9 - 4.9 g/dL   Globulin, Total 2.7 1.5 - 4.5 g/dL   Bilirubin Total 0.5 0.0 - 1.2 mg/dL   Alkaline Phosphatase 126 (H) 44 - 121 IU/L   AST 19 0 - 40 IU/L   ALT 28 0 - 32 IU/L  CBC with Differential/Platelet  Result Value Ref Range   WBC 7.2 3.4 - 10.8 x10E3/uL   RBC 5.04 3.77 - 5.28 x10E6/uL   Hemoglobin 14.4 11.1 - 15.9 g/dL   Hematocrit 16.1 09.6 - 46.6 %   MCV 87 79 - 97 fL   MCH 28.6 26.6 -  33.0 pg   MCHC 33.0 31.5 - 35.7 g/dL   RDW 04.5 40.9 - 81.1 %   Platelets 319 150 - 450 x10E3/uL   Neutrophils 48 Not Estab. %   Lymphs 41 Not Estab. %   Monocytes 8 Not Estab. %   Eos 2 Not Estab. %   Basos 1 Not Estab. %   Neutrophils Absolute 3.6 1.4 - 7.0 x10E3/uL   Lymphocytes Absolute 2.9 0.7 - 3.1 x10E3/uL   Monocytes Absolute 0.5 0.1 - 0.9 x10E3/uL   EOS (ABSOLUTE) 0.1 0.0 - 0.4 x10E3/uL   Basophils Absolute 0.0 0.0 - 0.2 x10E3/uL   Immature Granulocytes 0 Not Estab. %   Immature Grans (Abs) 0.0 0.0 - 0.1 x10E3/uL  Hgb A1c w/o eAG  Result Value Ref Range   Hgb A1c MFr Bld 6.4 (H) 4.8 - 5.6 %  Specimen status report  Result Value Ref Range   specimen status report Comment        08/07/2023    3:11 PM 06/15/2023    9:00 AM 06/15/2023    8:59 AM 04/07/2023   10:46 AM 03/19/2023    1:39 PM  Depression screen PHQ 2/9  Decreased Interest 1  0 0   Down, Depressed, Hopeless 0  0 0   PHQ - 2 Score 1  0 0   Altered sleeping 1 1  0 1  Tired, decreased energy 1 1  0 0  Change in appetite 0 0  0 0  Feeling bad or failure about yourself  1 0  0 1  Trouble concentrating 0 1  0 0  Moving slowly or fidgety/restless 0 0  0 0  Suicidal thoughts 0 0  0 0  PHQ-9 Score 4   0   Difficult doing work/chores Not difficult at all Not difficult at all  Not difficult at all Not difficult at all       08/07/2023    3:12 PM 06/15/2023    9:00 AM 04/07/2023   10:46 AM 03/19/2023    1:37 PM  GAD 7 : Generalized Anxiety Score  Nervous, Anxious, on Edge 1 1 0 1  Control/stop worrying 0 0 0 1  Worry too much - different things 0 1 0 0  Trouble relaxing 0 1 0 0  Restless 0 0 0 0  Easily annoyed or irritable 1 0 0 0  Afraid - awful might happen 0 1 0 0  Total GAD 7 Score 2 4 0 2  Anxiety Difficulty Not difficult at all Not difficult at all Not difficult at all  Not difficult at all   Pertinent labs & imaging results that were available during my care of the patient were reviewed by me and  considered in my medical decision making.  Assessment & Plan:  Kaitlin "Molli" was seen today for neuropathy, dizziness, blurred vision.  Diagnoses and all orders for this visit:  Prediabetes Will start medication as below. Patient to follow up with PCP. Labs as below. Will communicate results to patient once available. Will await results to determine next steps. Discussed with patient to continue to check BG once daily fasting am.  -     metFORMIN (GLUCOPHAGE) 500 MG tablet; Take 1 tablet (500 mg total) by mouth 2 (two) times daily with a meal. Take once daily for 2 weeks, then increased to twice daily. -     Vitamin B12 -     VITAMIN D 25 Hydroxy (Vit-D Deficiency, Fractures) -     TSH  Vision changes Referral placed as below for further evaluation. Labs as below. Will communicate results to patient once available. Will await results to determine next steps.  -     Ambulatory referral to Ophthalmology -     Vitamin B12 -     VITAMIN D 25 Hydroxy (Vit-D Deficiency, Fractures) -     TSH  Dizziness Labs as below. Will communicate results to patient once available. Will await results to determine next steps.  -     Vitamin B12 -     VITAMIN D 25 Hydroxy (Vit-D Deficiency, Fractures) -     TSH  Fibromyalgia Refill provided.  -     gabapentin (NEURONTIN) 300 MG capsule; Take 1 capsule (300 mg total) by mouth daily as needed.    Continue all other maintenance medications.  Follow up plan: Return for w/ PCP next month .   Continue healthy lifestyle choices, including diet (rich in fruits, vegetables, and lean proteins, and low in salt and simple carbohydrates) and exercise (at least 30 minutes of moderate physical activity daily).  Written and verbal instructions provided   The above assessment and management plan was discussed with the patient. The patient verbalized understanding of and has agreed to the management plan. Patient is aware to call the clinic if they develop any  new symptoms or if symptoms persist or worsen. Patient is aware when to return to the clinic for a follow-up visit. Patient educated on when it is appropriate to go to the emergency department.   Neale Burly, DNP-FNP [facility] [address] [place], [place] [address] [phone]

## 2023-08-08 LAB — VITAMIN B12: Vitamin B-12: 330 pg/mL (ref 232–1245)

## 2023-08-08 LAB — VITAMIN D 25 HYDROXY (VIT D DEFICIENCY, FRACTURES): Vit D, 25-Hydroxy: 10.4 ng/mL — ABNORMAL LOW (ref 30.0–100.0)

## 2023-08-08 LAB — TSH: TSH: 1.08 u[IU]/mL (ref 0.450–4.500)

## 2023-08-11 MED ORDER — VITAMIN D (ERGOCALCIFEROL) 1.25 MG (50000 UNIT) PO CAPS: 50000.0000 [IU] | ORAL_CAPSULE | ORAL | 0 refills | Status: DC

## 2023-08-11 NOTE — Addendum Note (Signed)
Addended by: Neale Burly on: 08/11/2023 07:52 AM   Modules accepted: Orders

## 2023-08-11 NOTE — Progress Notes (Signed)
Vitamin D level is low. I have sent in a weekly supplement to take for the next 12 weeks. After that, take a daily OTC vitamin D supplement with 1000-2000 IU.

## 2023-08-16 ENCOUNTER — Other Ambulatory Visit: Payer: Self-pay | Admitting: Family Medicine

## 2023-08-19 LAB — HM DIABETES EYE EXAM

## 2023-09-07 ENCOUNTER — Ambulatory Visit: Payer: Self-pay | Admitting: Family Medicine

## 2023-09-07 NOTE — Telephone Encounter (Signed)
Reason for Disposition . Third attempt to contact caller AND no contact made. Phone number verified.  Answer Assessment - Initial Assessment Questions 1. REASON FOR CALL or QUESTION: "What is your reason for calling today?" or "How can I best help you?" or "What question do you have that I can help answer?"     Caller disconnected while attempt to warm transfer to a triage nurse regarding nose bleed.  Protocols used: Information Only Call - No Triage-A-AH, No Contact or Duplicate Contact Call-A-AH

## 2023-09-07 NOTE — Telephone Encounter (Signed)
  Chief Complaint: nose bleed Symptoms: Headache, nose bleed >30 minutes Frequency: once, approx 1330 Pertinent Negatives: Patient denies dizziness, blood thinner, SOB Disposition: [] ED /[] Urgent Care (no appt availability in office) / [x] Appointment(In office/virtual)/ []  [facility]/ [] Home Care/ [] Refused Recommended Disposition /[] [facility]/ []  Follow-up with PCP Additional Notes: This RN returned pt call for triage. Pt states that around approx 1330 today while at work she developed a nosebleed. Denies injury or hx of epistaxis. States it was "very liquid coming out of both nostrils". States it lasted approx 35-40 minutes. It has since stopped and has not occurred again. Pt states she has had a sinus infection and headache recently that she feels is moving into her chest and is scheduled to see PCP tomorrow at 1100 for this matter. Pt admits she did not take medication for hypertension today, states that the school nurse was present during event but did not check her blood pressure. She stated she feels the nosebleed was caused by sinus infection and missed medication. Per protocol, pt to be seen within 24 hours- pt previously scheduled for visit within this time frame. Pt educated on worsening s/s and advised to call back or seek emergency care if necessary. Pt verbalized understanding. Alerting PCP for review. Reason for Disposition . [1] Large amount of blood has been lost (e.g., 1 cup or 240 ml) AND [2] bleeding now controlled (stopped)  Answer Assessment - Initial Assessment Questions 1. AMOUNT OF BLEEDING: "How bad is the bleeding?" "How much blood was lost?" "Has the bleeding stopped?"   - MILD: needed a couple tissues   - MODERATE: needed many tissues   - SEVERE: large blood clots, soaked many tissues, lasted more than 30 minutes      Severe, very liquid both nostrils dripping for approx 20-25 minutes, trickled for 15 minutes, stopped now 2. ONSET: "When did the  nosebleed start?"      Around 1330 today 3. FREQUENCY: "How many nosebleeds have you had in the last 24 hours?"      one 4. RECURRENT SYMPTOMS: "Have there been other recent nosebleeds?" If Yes, ask: "How long did it take you to stop the bleeding?" "What worked best?"      Has never had nosebleeds before, states used ice and pressure under nose and bridge of nose 5. CAUSE: "What do you think caused this nosebleed?"     Sinus infection 6. LOCAL FACTORS: "Do you have any cold symptoms?", "Have you been rubbing or picking at your nose?"     Sinus pressure, moving into chest, headache 7. SYSTEMIC FACTORS: "Do you have high blood pressure or any bleeding problems?"     Htn, did not take medication today, 8. BLOOD THINNERS: "Do you take any blood thinners?" (e.g., aspirin, clopidogrel / Plavix, coumadin, heparin). Notes: Other strong blood thinners include: Arixtra (fondaparinux), Eliquis (apixaban), Pradaxa (dabigatran), and Xarelto (rivaroxaban).     denies 9. OTHER SYMPTOMS: "Do you have any other symptoms?" (e.g., lightheadedness)     headache  Protocols used: Nosebleed-A-AH

## 2023-09-07 NOTE — Telephone Encounter (Signed)
Copied from CRM [id number]. Topic: Clinical - Red Word Triage >> Sep 07, 2023  2:04 PM Dennison Nancy wrote: Red Word that prompted transfer to Nurse Triage: Blood nose for about 30 to 45 minutes.  Call # [phone]

## 2023-09-07 NOTE — Telephone Encounter (Signed)
Caller disconnected prior to completion of warm transfer.  Attempted to call back but no answer and the mailbox was full and unable to leave voicemail.

## 2023-09-08 ENCOUNTER — Ambulatory Visit: Payer: BC Managed Care – PPO | Admitting: Family Medicine

## 2023-09-08 DIAGNOSIS — J0100 Acute maxillary sinusitis, unspecified: Secondary | ICD-10-CM

## 2023-09-08 DIAGNOSIS — J683 Other acute and subacute respiratory conditions due to chemicals, gases, fumes and vapors: Secondary | ICD-10-CM

## 2023-09-08 MED ORDER — AMOXICILLIN-POT CLAVULANATE 875-125 MG PO TABS
1.0000 | ORAL_TABLET | Freq: Two times a day (BID) | ORAL | 0 refills | Status: AC
Start: 2023-09-08 — End: 2023-09-15

## 2023-09-08 MED ORDER — ALBUTEROL SULFATE HFA 108 (90 BASE) MCG/ACT IN AERS
2.0000 | INHALATION_SPRAY | Freq: Four times a day (QID) | RESPIRATORY_TRACT | 2 refills | Status: AC | PRN
Start: 2023-09-08 — End: ?

## 2023-09-08 NOTE — Progress Notes (Signed)
Virtual Visit via Video Note  I connected with Kaitlin Evans on 09/08/23 at 11:05 AM EST by a video enabled telemedicine application and verified that I am speaking with the correct person using two identifiers.  Patient Location: Other:  work Dispensing optician: Office/Clinic  I discussed the limitations, risks, security, and privacy concerns of performing an evaluation and management service by video and the availability of in person appointments. I also discussed with the patient that there may be a patient responsible charge related to this service. The patient expressed understanding and agreed to proceed.  Subjective: PCP: [name], Elige Radon, MD  Chief Complaint  Patient presents with   Sinusitis   Sinusitis   States that she has sinus infection. 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  Reports that she had nosebleed last week for 30 minutes after blowing her nose.  Denies trouble breathing. Reports nasal congestion, rhinorrhea, productive cough with yellow/green/brown mucus. Endorses chest tightness with breathing. Symptoms started 2 weeks ago. Has used her inhaler more than normal per instructions from triage RN. Denies fever, ear pain. Endorses some gum soreness (has dentures). Has been taking mucinex d, but stopped once she saw the school RN on Friday and was instructed to wait until she saw a provider.   ROS: Per HPI  Current Outpatient Medications:    albuterol (VENTOLIN HFA) 108 (90 Base) MCG/ACT inhaler, Inhale 2 puffs into the lungs every 6 (six) hours as needed for wheezing or shortness of breath., Disp: 16 g, Rfl: 2   ALPRAZolam (XANAX) 0.5 MG tablet, Take 0.5-1 tablets (0.25-0.5 mg total) by mouth 2 (two) times daily as needed for anxiety., Disp: 20 tablet, Rfl: 2   celecoxib (CELEBREX) 200 MG capsule, Take 1 capsule (200 mg total) by mouth 2 (two) times daily., Disp: 60 capsule, Rfl: 3    cyclobenzaprine (FLEXERIL) 10 MG tablet, Take 1 tablet (10 mg total) by mouth 3 (three) times daily as needed for muscle spasms., Disp: 30 tablet, Rfl: 5   DULoxetine (CYMBALTA) 30 MG capsule, Take 1 capsule (30 mg total) by mouth daily., Disp: 90 capsule, Rfl: 3   fluticasone (FLONASE) 50 MCG/ACT nasal spray, Place 1 spray into both nostrils 2 (two) times daily as needed for allergies or rhinitis., Disp: 16 g, Rfl: 3   gabapentin (NEURONTIN) 300 MG capsule, Take 1 capsule (300 mg total) by mouth daily as needed., Disp: 90 capsule, Rfl: 0   hydrochlorothiazide (HYDRODIURIL) 25 MG tablet, Take 1 tablet (25 mg total) by mouth daily., Disp: 90 tablet, Rfl: 3   losartan (COZAAR) 100 MG tablet, Take 1 tablet (100 mg total) by mouth daily., Disp: 90 tablet, Rfl: 3   metFORMIN (GLUCOPHAGE) 500 MG tablet, Take 1 tablet (500 mg total) by mouth 2 (two) times daily with a meal. Take once daily for 2 weeks, then increased to twice daily., Disp: 180 tablet, Rfl: 0   rosuvastatin (CRESTOR) 5 MG tablet, Take 1 tablet (5 mg total) by mouth daily., Disp: 90 tablet, Rfl: 3   triamcinolone cream (KENALOG) 0.1 %, APPLY TO AFFECTED AREA TWICE A DAY, Disp: 30 g, Rfl: 1   Vitamin D, Ergocalciferol, (DRISDOL) 1.25 MG (50000 UNIT) CAPS capsule, Take 1 capsule (50,000 Units total) by mouth every 7 (seven) days for 12 doses., Disp: 12 capsule, Rfl: 0  Observations/Objective: There were no vitals filed for this visit. Physical Exam Constitutional:  General: She is awake. She is not in acute distress.    Appearance: Normal appearance. She is well-developed and well-groomed. She is not ill-appearing, toxic-appearing or diaphoretic.  HENT:     Head:     Comments: Congestion, cough present  Pulmonary:     Effort: Pulmonary effort is normal.  Neurological:     General: No focal deficit present.     Mental Status: She is alert and oriented to person, place, and time.  Psychiatric:        Attention and Perception:  Attention and perception normal.        Mood and Affect: Mood and affect normal.        Speech: Speech normal.        Behavior: Behavior normal. Behavior is cooperative.        Thought Content: Thought content normal.        Cognition and Memory: Cognition and memory normal.        Judgment: Judgment normal.    Assessment and Plan: 1. Acute non-recurrent maxillary sinusitis Will start medication as below. Patient to follow up if symptoms worsen for in person visit and possible imaging to rule out pneumonia.  - amoxicillin-clavulanate (AUGMENTIN) 875-125 MG tablet; Take 1 tablet by mouth 2 (two) times daily for 7 days.  Dispense: 14 tablet; Refill: 0  2. Reactive airways dysfunction syndrome (HCC) Refill as below.  - albuterol (VENTOLIN HFA) 108 (90 Base) MCG/ACT inhaler; Inhale 2 puffs into the lungs every 6 (six) hours as needed for wheezing or shortness of breath.  Dispense: 16 g; Refill: 2  Follow Up Instructions: Return if symptoms worsen or fail to improve. Appt time 09/08/2023 11:05 AM Call duration: 00:07:05   I discussed the assessment and treatment plan with the patient. The patient was provided an opportunity to ask questions, and all were answered. The patient agreed with the plan and demonstrated an understanding of the instructions.   The patient was advised to call back or seek an in-person evaluation if the symptoms worsen or if the condition fails to improve as anticipated.  The above assessment and management plan was discussed with the patient. The patient verbalized understanding of and has agreed to the management plan.   Neale Burly, DNP-FNP [facility] [address] [place], [place] [address] [phone]

## 2023-09-22 NOTE — Telephone Encounter (Signed)
Dr [name] does not have apts before 09/24/2023.  Copied from CRM [id number]. Topic: Appointments - Scheduling Inquiry for Clinic >> Sep 22, 2023  8:09 AM Kaitlin Evans wrote: Reason for CRM: Pt wants to be notified if an appt. Becomes available before 12/19

## 2023-09-23 ENCOUNTER — Ambulatory Visit: Payer: BC Managed Care – PPO

## 2023-09-23 ENCOUNTER — Encounter: Payer: Self-pay | Admitting: Family Medicine

## 2023-09-23 VITALS — BP 138/85 | HR 94 | Ht 62.0 in | Wt 279.0 lb

## 2023-09-23 DIAGNOSIS — R7303 Prediabetes: Secondary | ICD-10-CM | POA: Insufficient documentation

## 2023-09-23 DIAGNOSIS — M797 Fibromyalgia: Secondary | ICD-10-CM

## 2023-09-23 DIAGNOSIS — I10 Essential (primary) hypertension: Secondary | ICD-10-CM

## 2023-09-23 LAB — BAYER DCA HB A1C WAIVED: HB A1C (BAYER DCA - WAIVED): 5.9 % — ABNORMAL HIGH (ref 4.8–5.6)

## 2023-09-23 MED ORDER — HYDROCHLOROTHIAZIDE 25 MG PO TABS
25.0000 mg | ORAL_TABLET | Freq: Every day | ORAL | 3 refills | Status: AC
Start: 2023-09-23 — End: ?

## 2023-09-23 MED ORDER — GABAPENTIN 300 MG PO CAPS
300.0000 mg | ORAL_CAPSULE | Freq: Every day | ORAL | 1 refills | Status: DC | PRN
Start: 2023-09-23 — End: 2024-04-05

## 2023-09-23 MED ORDER — ROSUVASTATIN CALCIUM 5 MG PO TABS: 5.0000 mg | ORAL_TABLET | Freq: Every day | ORAL | 3 refills | Status: AC

## 2023-09-23 NOTE — Progress Notes (Signed)
BP 138/85   Pulse 94   Ht 5\' 2"  (1.575 m)   Wt 279 lb (126.6 kg)   SpO2 97%   BMI 51.03 kg/m    Subjective:   Patient ID: Kaitlin Evans, female    DOB: March 17, 1971, 52 y.o.   MRN: [id number]  HPI: Kaitlin Evans is a 52 y.o. female presenting on 09/23/2023 for Prediabetes   HPI Prediabetes Patient comes in today for recheck of his diabetes. Patient has been currently taking metformin although she admits she is rarely taking it. Patient is currently on an ACE inhibitor/ARB. Patient has seen an ophthalmologist this year. Patient denies any new issues with their feet. The symptom started onset as an adult hypertension ARE RELATED TO DM.  She is focusing a lot on diet.  She is making some changes and they have been hard but she is trying to make them gradually little by little  Hypertension Patient is currently on losartan and hydrochlorothiazide, and their blood pressure today is 138/85. Patient denies any lightheadedness or dizziness. Patient denies headaches, blurred vision, chest pains, shortness of breath, or weakness. Denies any side effects from medication and is content with current medication.   Fibromyalgia Doing well on gabapentin.  Will keep it going.  She does need refills today  Relevant past medical, surgical, family and social history reviewed and updated as indicated. Interim medical history since our last visit reviewed. Allergies and medications reviewed and updated.  Review of Systems  Constitutional:  Negative for chills and fever.  HENT:  Positive for congestion and sinus pressure.   Eyes:  Negative for visual disturbance.  Respiratory:  Negative for chest tightness and shortness of breath.   Cardiovascular:  Negative for chest pain and leg swelling.  Genitourinary:  Negative for difficulty urinating and dysuria.  Musculoskeletal:  Negative for back pain and gait problem.  Skin:  Negative for rash.  Neurological:  Negative for dizziness, light-headedness  and headaches.  Psychiatric/Behavioral:  Negative for agitation and behavioral problems.   All other systems reviewed and are negative.   Per HPI unless specifically indicated above   Allergies as of 09/23/2023       Reactions   Clindamycin/lincomycin Rash   Latex Rash   Sulfa Antibiotics Nausea And Vomiting, Other (See Comments)   Nausea with severe vomiting Nausea with severe vomiting    Nausea with severe vomiting Nausea with severe vomiting Nausea with severe vomiting Nausea with severe vomiting        Medication List        Accurate as of September 23, 2023  8:44 AM. If you have any questions, ask your nurse or doctor.          STOP taking these medications    celecoxib 200 MG capsule Commonly known as: CELEBREX Stopped by: Elige Radon Jeylin Woodmansee   metFORMIN 500 MG tablet Commonly known as: GLUCOPHAGE Stopped by: Elige Radon Hillis Mcphatter       TAKE these medications    albuterol 108 (90 Base) MCG/ACT inhaler Commonly known as: VENTOLIN HFA Inhale 2 puffs into the lungs every 6 (six) hours as needed for wheezing or shortness of breath.   ALPRAZolam 0.5 MG tablet Commonly known as: Xanax Take 0.5-1 tablets (0.25-0.5 mg total) by mouth 2 (two) times daily as needed for anxiety.   cyclobenzaprine 10 MG tablet Commonly known as: FLEXERIL Take 1 tablet (10 mg total) by mouth 3 (three) times daily as needed for muscle spasms.   DULoxetine 30 MG capsule  Commonly known as: CYMBALTA Take 1 capsule (30 mg total) by mouth daily.   fluticasone 50 MCG/ACT nasal spray Commonly known as: FLONASE Place 1 spray into both nostrils 2 (two) times daily as needed for allergies or rhinitis.   gabapentin 300 MG capsule Commonly known as: Neurontin Take 1 capsule (300 mg total) by mouth daily as needed.   hydrochlorothiazide 25 MG tablet Commonly known as: HYDRODIURIL Take 1 tablet (25 mg total) by mouth daily.   losartan 100 MG tablet Commonly known as: COZAAR Take 1  tablet (100 mg total) by mouth daily.   rosuvastatin 5 MG tablet Commonly known as: Crestor Take 1 tablet (5 mg total) by mouth daily.   triamcinolone cream 0.1 % Commonly known as: KENALOG APPLY TO AFFECTED AREA TWICE A DAY   Vitamin D (Ergocalciferol) 1.25 MG (50000 UNIT) Caps capsule Commonly known as: DRISDOL Take 1 capsule (50,000 Units total) by mouth every 7 (seven) days for 12 doses.         Objective:   BP 138/85   Pulse 94   Ht 5\' 2"  (1.575 m)   Wt 279 lb (126.6 kg)   SpO2 97%   BMI 51.03 kg/m   Wt Readings from Last 3 Encounters:  09/23/23 279 lb (126.6 kg)  08/07/23 283 lb (128.4 kg)  06/15/23 284 lb (128.8 kg)    Physical Exam Vitals and nursing note reviewed.  Constitutional:      General: She is not in acute distress.    Appearance: She is well-developed. She is not diaphoretic.  Eyes:     Conjunctiva/sclera: Conjunctivae normal.  Cardiovascular:     Rate and Rhythm: Normal rate and regular rhythm.     Heart sounds: Normal heart sounds. No murmur heard. Pulmonary:     Effort: Pulmonary effort is normal. No respiratory distress.     Breath sounds: Normal breath sounds. No wheezing.  Musculoskeletal:        General: No tenderness. Normal range of motion.  Skin:    General: Skin is warm and dry.     Findings: No rash.  Neurological:     Mental Status: She is alert and oriented to person, place, and time.     Coordination: Coordination normal.  Psychiatric:        Behavior: Behavior normal.     Results for orders placed or performed in visit on 08/31/23  HM DIABETES EYE EXAM   Collection Time: 08/19/23 12:00 AM  Result Value Ref Range   HM Diabetic Eye Exam No Retinopathy No Retinopathy    Assessment & Plan:   Problem List Items Addressed This Visit       Cardiovascular and Mediastinum   Hypertension   Relevant Medications   hydrochlorothiazide (HYDRODIURIL) 25 MG tablet   rosuvastatin (CRESTOR) 5 MG tablet     Other    Prediabetes - Primary   Relevant Orders   Bayer DCA Hb A1c Waived   Fibromyalgia   Relevant Medications   gabapentin (NEURONTIN) 300 MG capsule   Other Visit Diagnoses       Essential hypertension       Relevant Medications   hydrochlorothiazide (HYDRODIURIL) 25 MG tablet   rosuvastatin (CRESTOR) 5 MG tablet       A1c looks good at 5.9.  Do not think she needs the metformin at this point.  Continue with diet. Follow up plan: Return in about 3 months (around 12/22/2023), or if symptoms worsen or fail to improve, for Prediabetes recheck.  Counseling provided for all of the vaccine components Orders Placed This Encounter  Procedures   Bayer DCA Hb A1c Waived    Arville Care, MD [facility] 09/23/2023, 8:44 AM

## 2023-09-24 ENCOUNTER — Ambulatory Visit: Payer: BC Managed Care – PPO | Admitting: Family Medicine

## 2023-10-29 ENCOUNTER — Other Ambulatory Visit: Payer: Self-pay | Admitting: Family Medicine

## 2023-10-29 DIAGNOSIS — R7303 Prediabetes: Secondary | ICD-10-CM

## 2023-10-31 ENCOUNTER — Other Ambulatory Visit: Payer: Self-pay | Admitting: Family Medicine

## 2023-11-18 ENCOUNTER — Encounter: Payer: Self-pay | Admitting: Family Medicine

## 2023-11-19 ENCOUNTER — Ambulatory Visit: Payer: 59 | Admitting: Family Medicine

## 2023-11-19 ENCOUNTER — Encounter: Payer: Self-pay | Admitting: Family Medicine

## 2023-11-19 VITALS — BP 132/92 | HR 78 | Temp 97.4°F | Ht 62.0 in | Wt 272.6 lb

## 2023-11-19 DIAGNOSIS — B349 Viral infection, unspecified: Secondary | ICD-10-CM | POA: Diagnosis not present

## 2023-11-19 DIAGNOSIS — J683 Other acute and subacute respiratory conditions due to chemicals, gases, fumes and vapors: Secondary | ICD-10-CM

## 2023-11-19 DIAGNOSIS — Z20828 Contact with and (suspected) exposure to other viral communicable diseases: Secondary | ICD-10-CM

## 2023-11-19 LAB — VERITOR FLU A/B WAIVED
Influenza A: NEGATIVE
Influenza B: NEGATIVE

## 2023-11-19 MED ORDER — PREDNISONE 20 MG PO TABS: 40.0000 mg | ORAL_TABLET | Freq: Every day | ORAL | 0 refills | Status: AC

## 2023-11-19 NOTE — Progress Notes (Signed)
Acute Office Visit  Subjective:     Patient ID: Kaitlin Evans, female    DOB: 02-May-1971, 53 y.o.   MRN: [id number]  Chief Complaint  Patient presents with   Generalized Body Aches    URI  This is a new problem. Episode onset: 3 days ago. The maximum temperature recorded prior to her arrival was 100.4 - 100.9 F. The fever has been present for 1 to 2 days. Associated symptoms include congestion, coughing, ear pain, joint pain, joint swelling, sinus pain, a sore throat and wheezing. Pertinent negatives include no abdominal pain, chest pain, diarrhea, headaches, nausea or vomiting. Treatments tried: mucinex dm. The treatment provided no relief.   Hx of asthma. She has had increased wheezing, shortness of breath.   Husband has been sick. 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    Review of Systems  HENT:  Positive for congestion, ear pain, sinus pain and sore throat.   Respiratory:  Positive for cough and wheezing.   Cardiovascular:  Negative for chest pain.  Gastrointestinal:  Negative for abdominal pain, diarrhea, nausea and vomiting.  Musculoskeletal:  Positive for joint pain.  Neurological:  Negative for headaches.        Objective:    BP (!) 132/92   Pulse 78   Temp (!) 97.4 F (36.3 C) (Oral)   Ht 5\' 2"  (1.575 m)   Wt 272 lb 9.6 oz (123.7 kg)   SpO2 97%   BMI 49.86 kg/m    Physical Exam Vitals and nursing note reviewed.  Constitutional:      General: She is not in acute distress.    Appearance: She is ill-appearing. She is not toxic-appearing or diaphoretic.  HENT:     Right Ear: Tympanic membrane, ear canal and external ear normal.     Left Ear: Tympanic membrane, ear canal and external ear normal.     Nose: Congestion present.     Mouth/Throat:     Mouth: Mucous membranes are moist.     Pharynx: Oropharynx is clear. No oropharyngeal exudate or posterior oropharyngeal erythema.  Eyes:     General:        Right  eye: No discharge.        Left eye: No discharge.     Conjunctiva/sclera: Conjunctivae normal.  Cardiovascular:     Rate and Rhythm: Normal rate and regular rhythm.     Heart sounds: Normal heart sounds. No murmur heard. Pulmonary:     Effort: Pulmonary effort is normal. No respiratory distress.     Breath sounds: Normal breath sounds. No wheezing, rhonchi or rales.  Abdominal:     General: Bowel sounds are normal. There is no distension.     Palpations: Abdomen is soft.     Tenderness: There is no abdominal tenderness. There is no guarding or rebound.  Musculoskeletal:     Right lower leg: No edema.     Left lower leg: No edema.  Skin:    General: Skin is warm and dry.  Neurological:     General: No focal deficit present.     Mental Status: She is alert and oriented to person, place, and time.  Psychiatric:        Mood and Affect: Mood normal.        Behavior: Behavior normal.     No results found for any visits on 11/19/23.      Assessment & Plan:   Kaitlin "  Molli" was seen today for generalized body aches.  Diagnoses and all orders for this visit:  Viral illness Negative rapid flu today. Discussed symptomatic care and return precautions.  -     Veritor Flu A/B Waived  Reactive airways dysfunction syndrome (HCC) Prednisone as below for acute exacerbation. Use albuterol prn.  -     predniSONE (DELTASONE) 20 MG tablet; Take 2 tablets (40 mg total) by mouth daily with breakfast for 5 days.  Exposure to the flu -     Veritor Flu A/B Waived  Return to office for new or worsening symptoms, or if symptoms persist.   The patient indicates understanding of these issues and agrees with the plan.  Kaitlin Earing, FNP

## 2023-12-25 ENCOUNTER — Ambulatory Visit: Payer: BC Managed Care – PPO | Admitting: Family Medicine

## 2023-12-25 ENCOUNTER — Encounter: Payer: Self-pay | Admitting: Family Medicine

## 2023-12-25 VITALS — BP 147/94 | HR 98 | Ht 62.0 in | Wt 277.0 lb

## 2023-12-25 DIAGNOSIS — R7989 Other specified abnormal findings of blood chemistry: Secondary | ICD-10-CM

## 2023-12-25 DIAGNOSIS — F419 Anxiety disorder, unspecified: Secondary | ICD-10-CM | POA: Diagnosis not present

## 2023-12-25 DIAGNOSIS — F320 Major depressive disorder, single episode, mild: Secondary | ICD-10-CM

## 2023-12-25 DIAGNOSIS — R7303 Prediabetes: Secondary | ICD-10-CM

## 2023-12-25 DIAGNOSIS — I10 Essential (primary) hypertension: Secondary | ICD-10-CM

## 2023-12-25 LAB — BAYER DCA HB A1C WAIVED: HB A1C (BAYER DCA - WAIVED): 6.2 % — ABNORMAL HIGH (ref 4.8–5.6)

## 2023-12-25 NOTE — Progress Notes (Signed)
 BP (!) 147/94   Pulse 98   Ht 5\' 2"  (1.575 m)   Wt 277 lb (125.6 kg)   SpO2 99%   BMI 50.66 kg/m    Subjective:   Patient ID: Kaitlin Evans, female    DOB: September 01, 1971, 53 y.o.   MRN: [id number]  HPI: Kaitlin Evans is a 53 y.o. female presenting on 12/25/2023 for Medical Management of Chronic Issues and Anxiety   HPI Anxiety depression recheck Patient feels like anxiety and depression is doing okay.  She rarely uses the alprazolam.  She denies any suicidal ideations or thoughts of hurting herself.    12/25/2023    8:27 AM 09/23/2023    8:15 AM 08/07/2023    3:11 PM 06/15/2023    9:00 AM 06/15/2023    8:59 AM  Depression screen PHQ 2/9  Decreased Interest 0 0 1  0  Down, Depressed, Hopeless 1 0 0  0  PHQ - 2 Score 1 0 1  0  Altered sleeping 3 1 1 1    Tired, decreased energy 1 1 1 1    Change in appetite 0 0 0 0   Feeling bad or failure about yourself  1 0 1 0   Trouble concentrating 0 0 0 1   Moving slowly or fidgety/restless 1 0 0 0   Suicidal thoughts 0 0 0 0   PHQ-9 Score 7 2 4     Difficult doing work/chores Not difficult at all Not difficult at all Not difficult at all Not difficult at all     Hypertension Patient is currently on losartan and hydrochlorothiazide, and their blood pressure today is 147/94 but she is in pain today, she will keep an eye on it. Patient denies any lightheadedness or dizziness. Patient denies headaches, blurred vision, chest pains, shortness of breath, or weakness. Denies any side effects from medication and is content with current medication.   Prediabetes and obesity Patient comes in today for recheck of his diabetes. Patient has been currently taking no medicine, trying for diet control. Patient is currently on an ACE inhibitor/ARB. Patient has not seen an ophthalmologist this year. Patient denies any new issues with their feet. The symptom started onset as an adult hypertension ARE RELATED TO DM   Patient has a little bit of back  pain. She says she has had some back pain that goes down into her legs and up to her neck.  She has been bothering her over the past few days and she does not know what she did but just been a lot more sore.  She has not been able to take her muscle relaxer because she cannot take it during the school days and wonders if she can get a day off to be able to take some anti-inflammatories and muscle relaxers to help her.  Relevant past medical, surgical, family and social history reviewed and updated as indicated. Interim medical history since our last visit reviewed. Allergies and medications reviewed and updated.  Review of Systems  Constitutional:  Negative for chills and fever.  Eyes:  Negative for redness and visual disturbance.  Respiratory:  Negative for chest tightness and shortness of breath.   Cardiovascular:  Negative for chest pain and leg swelling.  Genitourinary:  Negative for difficulty urinating and dysuria.  Musculoskeletal:  Negative for back pain and gait problem.  Skin:  Negative for rash.  Neurological:  Negative for dizziness, light-headedness and headaches.  Psychiatric/Behavioral:  Negative for agitation and behavioral problems.   All  other systems reviewed and are negative.   Per HPI unless specifically indicated above   Allergies as of 12/25/2023       Reactions   Clindamycin/lincomycin Rash   Latex Rash   Sulfa Antibiotics Nausea And Vomiting, Other (See Comments)   Nausea with severe vomiting Nausea with severe vomiting    Nausea with severe vomiting Nausea with severe vomiting Nausea with severe vomiting Nausea with severe vomiting        Medication List        Accurate as of December 25, 2023  8:57 AM. If you have any questions, ask your nurse or doctor.          albuterol 108 (90 Base) MCG/ACT inhaler Commonly known as: VENTOLIN HFA Inhale 2 puffs into the lungs every 6 (six) hours as needed for wheezing or shortness of breath.   ALPRAZolam  0.5 MG tablet Commonly known as: Xanax Take 0.5-1 tablets (0.25-0.5 mg total) by mouth 2 (two) times daily as needed for anxiety.   clindamycin 150 MG capsule Commonly known as: CLEOCIN Take 150 mg by mouth 4 (four) times daily.   cyclobenzaprine 10 MG tablet Commonly known as: FLEXERIL Take 1 tablet (10 mg total) by mouth 3 (three) times daily as needed for muscle spasms.   fluticasone 50 MCG/ACT nasal spray Commonly known as: FLONASE Place 1 spray into both nostrils 2 (two) times daily as needed for allergies or rhinitis.   gabapentin 300 MG capsule Commonly known as: Neurontin Take 1 capsule (300 mg total) by mouth daily as needed.   hydrochlorothiazide 25 MG tablet Commonly known as: HYDRODIURIL Take 1 tablet (25 mg total) by mouth daily.   losartan 100 MG tablet Commonly known as: COZAAR Take 1 tablet (100 mg total) by mouth daily.   rosuvastatin 5 MG tablet Commonly known as: Crestor Take 1 tablet (5 mg total) by mouth daily.   triamcinolone cream 0.1 % Commonly known as: KENALOG APPLY TO AFFECTED AREA TWICE A DAY   Vitamin D (Ergocalciferol) 1.25 MG (50000 UNIT) Caps capsule Commonly known as: DRISDOL Take 1 capsule (50,000 Units total) by mouth every 7 (seven) days.         Objective:   BP (!) 147/94   Pulse 98   Ht 5\' 2"  (1.575 m)   Wt 277 lb (125.6 kg)   SpO2 99%   BMI 50.66 kg/m   Wt Readings from Last 3 Encounters:  12/25/23 277 lb (125.6 kg)  11/19/23 272 lb 9.6 oz (123.7 kg)  09/23/23 279 lb (126.6 kg)    Physical Exam Vitals and nursing note reviewed.  Constitutional:      General: She is not in acute distress.    Appearance: She is well-developed. She is not diaphoretic.  Eyes:     Conjunctiva/sclera: Conjunctivae normal.  Cardiovascular:     Rate and Rhythm: Normal rate and regular rhythm.     Heart sounds: Normal heart sounds. No murmur heard. Pulmonary:     Effort: Pulmonary effort is normal. No respiratory distress.     Breath  sounds: Normal breath sounds. No wheezing.  Musculoskeletal:        General: No swelling. Normal range of motion.  Skin:    General: Skin is warm and dry.     Findings: No rash.  Neurological:     Mental Status: She is alert and oriented to person, place, and time.     Coordination: Coordination normal.  Psychiatric:  Behavior: Behavior normal.       Assessment & Plan:   Problem List Items Addressed This Visit       Cardiovascular and Mediastinum   Hypertension   Relevant Orders   CBC with Differential/Platelet   CMP14+EGFR   Lipid panel     Other   Anxiety   Depression, major, single episode, mild (HCC)   Prediabetes - Primary   Relevant Orders   Bayer DCA Hb A1c Waived   VITAMIN D 25 Hydroxy (Vit-D Deficiency, Fractures)   Morbid obesity (HCC)   Other Visit Diagnoses       Low vitamin D level       Relevant Orders   VITAMIN D 25 Hydroxy (Vit-D Deficiency, Fractures)     A1c 6.2 which is slightly elevated.  She is working on diet to lose weight but she is going to focus on that more especially coming closer to the summer.  Follow up plan: Return in about 3 months (around 03/26/2024), or if symptoms worsen or fail to improve, for Hypertension and prediabetes.  Counseling provided for all of the vaccine components Orders Placed This Encounter  Procedures   CBC with Differential/Platelet   CMP14+EGFR   Lipid panel   Bayer DCA Hb A1c Waived   VITAMIN D 25 Hydroxy (Vit-D Deficiency, Fractures)    Arville Care, MD [facility] 12/25/2023, 8:57 AM

## 2023-12-26 LAB — CMP14+EGFR
ALT: 30 IU/L (ref 0–32)
AST: 22 IU/L (ref 0–40)
Albumin: 4 g/dL (ref 3.9–4.9)
Alkaline Phosphatase: 127 IU/L — ABNORMAL HIGH (ref 44–121)
BUN/Creatinine Ratio: 12 (ref 9–23)
BUN: 9 mg/dL (ref 6–24)
Bilirubin Total: 0.4 mg/dL (ref 0.0–1.2)
CO2: 24 mmol/L (ref 20–29)
Calcium: 9.4 mg/dL (ref 8.7–10.2)
Chloride: 103 mmol/L (ref 96–106)
Creatinine, Ser: 0.75 mg/dL (ref 0.57–1.00)
Globulin, Total: 2.7 g/dL (ref 1.5–4.5)
Glucose: 158 mg/dL — ABNORMAL HIGH (ref 70–99)
Potassium: 3.8 mmol/L (ref 3.5–5.2)
Sodium: 144 mmol/L (ref 134–144)
Total Protein: 6.7 g/dL (ref 6.0–8.5)
eGFR: 97 mL/min/{1.73_m2} (ref >59)

## 2023-12-26 LAB — CBC WITH DIFFERENTIAL/PLATELET
Basophils Absolute: 0 10*3/uL (ref 0.0–0.2)
Basos: 1 %
EOS (ABSOLUTE): 0.1 10*3/uL (ref 0.0–0.4)
Eos: 2 %
Hematocrit: 42.7 % (ref 34.0–46.6)
Hemoglobin: 13.9 g/dL (ref 11.1–15.9)
Immature Grans (Abs): 0 10*3/uL (ref 0.0–0.1)
Immature Granulocytes: 0 %
Lymphocytes Absolute: 2.8 10*3/uL (ref 0.7–3.1)
Lymphs: 40 %
MCH: 28.8 pg (ref 26.6–33.0)
MCHC: 32.6 g/dL (ref 31.5–35.7)
MCV: 88 fL (ref 79–97)
Monocytes Absolute: 0.4 10*3/uL (ref 0.1–0.9)
Monocytes: 6 %
Neutrophils Absolute: 3.5 10*3/uL (ref 1.4–7.0)
Neutrophils: 51 %
Platelets: 291 10*3/uL (ref 150–450)
RBC: 4.83 x10E6/uL (ref 3.77–5.28)
RDW: 13.2 % (ref 11.7–15.4)
WBC: 6.9 10*3/uL (ref 3.4–10.8)

## 2023-12-26 LAB — VITAMIN D 25 HYDROXY (VIT D DEFICIENCY, FRACTURES): Vit D, 25-Hydroxy: 37.3 ng/mL (ref 30.0–100.0)

## 2023-12-26 LAB — LIPID PANEL
Chol/HDL Ratio: 2.9 ratio (ref 0.0–4.4)
Cholesterol, Total: 162 mg/dL (ref 100–199)
HDL: 55 mg/dL (ref >39)
LDL Chol Calc (NIH): 84 mg/dL (ref 0–99)
Triglycerides: 128 mg/dL (ref 0–149)
VLDL Cholesterol Cal: 23 mg/dL (ref 5–40)

## 2023-12-31 ENCOUNTER — Encounter: Payer: Self-pay | Admitting: Family Medicine

## 2024-02-19 ENCOUNTER — Other Ambulatory Visit: Payer: Self-pay | Admitting: Family Medicine

## 2024-02-19 DIAGNOSIS — I10 Essential (primary) hypertension: Secondary | ICD-10-CM

## 2024-04-06 ENCOUNTER — Ambulatory Visit: Admitting: Family Medicine

## 2024-04-06 VITALS — BP 129/85 | HR 106 | Ht 62.0 in | Wt 280.0 lb

## 2024-04-06 DIAGNOSIS — N951 Menopausal and female climacteric states: Secondary | ICD-10-CM | POA: Diagnosis not present

## 2024-04-06 DIAGNOSIS — M797 Fibromyalgia: Secondary | ICD-10-CM | POA: Diagnosis not present

## 2024-04-06 DIAGNOSIS — I10 Essential (primary) hypertension: Secondary | ICD-10-CM | POA: Diagnosis not present

## 2024-04-06 DIAGNOSIS — L219 Seborrheic dermatitis, unspecified: Secondary | ICD-10-CM

## 2024-04-06 DIAGNOSIS — F320 Major depressive disorder, single episode, mild: Secondary | ICD-10-CM

## 2024-04-06 DIAGNOSIS — R7303 Prediabetes: Secondary | ICD-10-CM

## 2024-04-06 LAB — BAYER DCA HB A1C WAIVED: HB A1C (BAYER DCA - WAIVED): 6.2 % — ABNORMAL HIGH (ref 4.8–5.6)

## 2024-04-06 MED ORDER — LOSARTAN POTASSIUM 100 MG PO TABS: 100.0000 mg | ORAL_TABLET | Freq: Every day | ORAL | 3 refills | Status: AC

## 2024-04-06 MED ORDER — GABAPENTIN 300 MG PO CAPS: 300.0000 mg | ORAL_CAPSULE | Freq: Three times a day (TID) | ORAL | 1 refills | Status: AC

## 2024-04-06 NOTE — Progress Notes (Signed)
 BP 129/85   Pulse (!) 106   Ht 5' 2 (1.575 m)   Wt 280 lb (127 kg)   SpO2 96%   BMI 51.21 kg/m    Subjective:   Patient ID: Kaitlin Evans, female    DOB: 1971/05/24, 53 y.o.   MRN: [id number]  HPI: Kaitlin Evans is a 53 y.o. female presenting on 04/06/2024 for Medical Management of Chronic Issues, Prediabetes, Burn (Right wrist), and check hormones   HPI Hypertension Patient is currently on hydrochlorothiazide  and losartan , she does admit that she is not taking consistently all the time but when she does take it her blood pressure looks good, and their blood pressure today is 129/85. Patient denies any lightheadedness or dizziness. Patient denies headaches, blurred vision, chest pains, shortness of breath, or weakness. Denies any side effects from medication and is content with current medication.   Prediabetes Patient comes in today for recheck of his diabetes. Patient has been currently taking no medicine currently, diet controlled. Patient is currently on an ACE inhibitor/ARB. Patient has not seen an ophthalmologist this year. Patient denies any new issues with their feet. The symptom started onset as an adult hypertension ARE RELATED TO DM, patient also takes rosuvastatin  for cholesterol and cardiac prevention.  Depression and anxiety recheck and fibromyalgia Patient is coming in for depression and anxiety and fibromyalgia recheck.  She says she mainly takes gabapentin  and occasionally uses the cyclobenzaprine .  Relevant past medical, surgical, family and social history reviewed and updated as indicated. Interim medical history since our last visit reviewed. Allergies and medications reviewed and updated.  Review of Systems  Constitutional:  Negative for chills and fever.  HENT:  Negative for congestion, ear discharge and ear pain.   Eyes:  Negative for redness and visual disturbance.  Respiratory:  Negative for chest tightness and shortness of breath.   Cardiovascular:   Negative for chest pain and leg swelling.  Genitourinary:  Negative for difficulty urinating and dysuria.  Skin:  Positive for rash.  Neurological:  Negative for dizziness, light-headedness and headaches.  Psychiatric/Behavioral:  Negative for agitation and behavioral problems.   All other systems reviewed and are negative.   Per HPI unless specifically indicated above   Allergies as of 04/06/2024       Reactions   Latex Rash   Sulfa Antibiotics Nausea And Vomiting, Other (See Comments)   Nausea with severe vomiting Nausea with severe vomiting    Nausea with severe vomiting Nausea with severe vomiting Nausea with severe vomiting Nausea with severe vomiting        Medication List        Accurate as of April 06, 2024  1:28 PM. If you have any questions, ask your nurse or doctor.          STOP taking these medications    ALPRAZolam  0.5 MG tablet Commonly known as: Xanax  Stopped by: [name] Zedrick Springsteen   clindamycin  150 MG capsule Commonly known as: CLEOCIN  Stopped by: [name] Demauri Advincula   triamcinolone  cream 0.1 % Commonly known as: KENALOG  Stopped by: [name] Payzlee Ryder   Vitamin D  (Ergocalciferol ) 1.25 MG (50000 UNIT) Caps capsule Commonly known as: DRISDOL  Stopped by: [name] Verlon Carcione       TAKE these medications    albuterol  108 (90 Base) MCG/ACT inhaler Commonly known as: VENTOLIN  HFA Inhale 2 puffs into the lungs every 6 (six) hours as needed for wheezing or shortness of breath.   cyclobenzaprine  10 MG tablet Commonly known as: FLEXERIL   Take 1 tablet (10 mg total) by mouth 3 (three) times daily as needed for muscle spasms.   fluticasone  50 MCG/ACT nasal spray Commonly known as: FLONASE  Place 1 spray into both nostrils 2 (two) times daily as needed for allergies or rhinitis.   gabapentin  300 MG capsule Commonly known as: Neurontin  Take 1 capsule (300 mg total) by mouth 3 (three) times daily. What changed:  when to take this reasons to take  this Changed by: [name] Antrice Pal   hydrochlorothiazide  25 MG tablet Commonly known as: HYDRODIURIL  Take 1 tablet (25 mg total) by mouth daily.   losartan  100 MG tablet Commonly known as: COZAAR  Take 1 tablet (100 mg total) by mouth daily.   rosuvastatin  5 MG tablet Commonly known as: Crestor  Take 1 tablet (5 mg total) by mouth daily.         Objective:   BP 129/85   Pulse (!) 106   Ht 5' 2 (1.575 m)   Wt 280 lb (127 kg)   SpO2 96%   BMI 51.21 kg/m   Wt Readings from Last 3 Encounters:  04/06/24 280 lb (127 kg)  12/25/23 277 lb (125.6 kg)  11/19/23 272 lb 9.6 oz (123.7 kg)    Physical Exam Vitals and nursing note reviewed.  Constitutional:      General: She is not in acute distress.    Appearance: She is well-developed. She is not diaphoretic.  Eyes:     Conjunctiva/sclera: Conjunctivae normal.  Cardiovascular:     Rate and Rhythm: Normal rate and regular rhythm.     Heart sounds: Normal heart sounds. No murmur heard. Pulmonary:     Effort: Pulmonary effort is normal. No respiratory distress.     Breath sounds: Normal breath sounds. No wheezing.  Musculoskeletal:        General: No tenderness. Normal range of motion.  Skin:    General: Skin is warm and dry.     Findings: Rash (Pink papular rash on both cheeks under her eyes and some on her nose.  No signs of erythema or purulence or drainage.) present.  Neurological:     Mental Status: She is alert and oriented to person, place, and time.     Coordination: Coordination normal.  Psychiatric:        Behavior: Behavior normal.       Assessment & Plan:   Problem List Items Addressed This Visit       Cardiovascular and Mediastinum   Hypertension - Primary   Relevant Medications   losartan  (COZAAR ) 100 MG tablet     Other   Depression, major, single episode, mild (HCC)   Relevant Orders   VITAMIN D  25 Hydroxy (Vit-D Deficiency, Fractures)   Sedimentation rate   C-reactive protein    Prediabetes   Relevant Orders   Bayer DCA Hb A1c Waived   Fibromyalgia   Relevant Medications   gabapentin  (NEURONTIN ) 300 MG capsule   Other Relevant Orders   VITAMIN D  25 Hydroxy (Vit-D Deficiency, Fractures)   Sedimentation rate   C-reactive protein   Other Visit Diagnoses       Essential hypertension       Relevant Medications   losartan  (COZAAR ) 100 MG tablet     Perimenopausal       Relevant Orders   FSH/LH   Estradiol     Seborrheic dermatitis           Recommended over-the-counter anticream for her seborrheic dermatitis on her face uses twice a  day.  Gabapentin  is helping her fibromyalgia and she takes it 3 times a day and feels like it is helping. Follow up plan: Return in about 3 months (around 07/07/2024), or if symptoms worsen or fail to improve, for Prediabetes hypertension.  Counseling provided for all of the vaccine components Orders Placed This Encounter  Procedures   Bayer DCA Hb A1c Waived   FSH/LH   Estradiol   VITAMIN D  25 Hydroxy (Vit-D Deficiency, Fractures)   Sedimentation rate   C-reactive protein    [name] Levins, MD [facility] 04/06/2024, 1:28 PM

## 2024-04-07 ENCOUNTER — Ambulatory Visit: Payer: Self-pay | Admitting: Family Medicine

## 2024-04-07 LAB — FSH/LH
FSH: 21.5 m[IU]/mL
LH: 9.8 m[IU]/mL

## 2024-04-07 LAB — ESTRADIOL: Estradiol: 14.8 pg/mL

## 2024-04-07 LAB — C-REACTIVE PROTEIN: CRP: 6 mg/L (ref 0–10)

## 2024-04-07 LAB — VITAMIN D 25 HYDROXY (VIT D DEFICIENCY, FRACTURES): Vit D, 25-Hydroxy: 27.7 ng/mL — ABNORMAL LOW (ref 30.0–100.0)

## 2024-04-07 LAB — SEDIMENTATION RATE: Sed Rate: 22 mm/h (ref 0–40)

## 2024-04-13 ENCOUNTER — Telehealth: Admitting: Physician Assistant

## 2024-04-13 ENCOUNTER — Ambulatory Visit: Payer: Self-pay

## 2024-04-13 DIAGNOSIS — B9689 Other specified bacterial agents as the cause of diseases classified elsewhere: Secondary | ICD-10-CM

## 2024-04-13 DIAGNOSIS — B379 Candidiasis, unspecified: Secondary | ICD-10-CM | POA: Diagnosis not present

## 2024-04-13 DIAGNOSIS — T3695XA Adverse effect of unspecified systemic antibiotic, initial encounter: Secondary | ICD-10-CM | POA: Diagnosis not present

## 2024-04-13 DIAGNOSIS — J019 Acute sinusitis, unspecified: Secondary | ICD-10-CM

## 2024-04-13 MED ORDER — FLUCONAZOLE 150 MG PO TABS: 150.0000 mg | ORAL_TABLET | ORAL | 0 refills | Status: DC | PRN

## 2024-04-13 MED ORDER — AMOXICILLIN-POT CLAVULANATE 875-125 MG PO TABS: 1.0000 | ORAL_TABLET | Freq: Two times a day (BID) | ORAL | 0 refills | Status: DC

## 2024-04-13 NOTE — Progress Notes (Signed)
 Virtual Visit Consent   Kaitlin Evans, you are scheduled for a virtual visit with a [facility] provider today. Just as with appointments in the office, your consent must be obtained to participate. Your consent will be active for this visit and any virtual visit you may have with one of our providers in the next 365 days. If you have a MyChart account, a copy of this consent can be sent to you electronically.  As this is a virtual visit, video technology does not allow for your provider to perform a traditional examination. This may limit your provider's ability to fully assess your condition. If your provider identifies any concerns that need to be evaluated in person or the need to arrange testing (such as labs, EKG, etc.), we will make arrangements to do so. Although advances in technology are sophisticated, we cannot ensure that it will always work on either your end or our end. If the connection with a video visit is poor, the visit may have to be switched to a telephone visit. With either a video or telephone visit, we are not always able to ensure that we have a secure connection.  By engaging in this virtual visit, you consent to the provision of healthcare and authorize for your insurance to be billed (if applicable) for the services provided during this visit. Depending on your insurance coverage, you may receive a charge related to this service.  I need to obtain your verbal consent now. Are you willing to proceed with your visit today? Kaitlin Evans has provided verbal consent on 04/13/2024 for a virtual visit (video or telephone). Kaitlin [name], [name]  Date: 04/13/2024 5:06 PM   Virtual Visit via Video Note   I, Kaitlin Evans, connected with  Kaitlin Evans  ([id number], 1971/03/23) on 04/13/24 at  5:00 PM EDT by a video-enabled telemedicine application and verified that I am speaking with the correct person using two identifiers.  Location: Patient: Virtual Visit  Location Patient: Home Provider: Virtual Visit Location Provider: Home Office   I discussed the limitations of evaluation and management by telemedicine and the availability of in person appointments. The patient expressed understanding and agreed to proceed.    History of Present Illness: Kaitlin Evans is a 53 y.o. who identifies as a female who was assigned female at birth, and is being seen today for sinus pain.  HPI: Sinusitis This is a new problem. The current episode started 1 to 4 weeks ago (1.5 weeks). The problem has been gradually worsening since onset. There has been no fever. Associated symptoms include chills, congestion, coughing (mild), diaphoresis, ear pain (pressure in right), headaches, sinus pressure and a sore throat (from drainage). Pertinent negatives include no hoarse voice, shortness of breath or sneezing. Past treatments include saline nose sprays (navage-green mucus with blood, allergy medications, mucinex). The treatment provided no relief.      Problems:  Patient Active Problem List   Diagnosis Date Noted   Prediabetes 09/23/2023   Skin tag 04/07/2023   Fibromyalgia 08/16/2021   Pain of toe of left foot 04/10/2021   Depression, major, single episode, mild (HCC) 07/02/2018   Morbid obesity (HCC) 12/31/2017   Hypertension 07/27/2017   Low back pain 07/27/2017   Anxiety 07/27/2017    Allergies:  Allergies  Allergen Reactions   Latex Rash   Sulfa Antibiotics Nausea And Vomiting and Other (See Comments)    Nausea with severe vomiting  Nausea with severe vomiting    Nausea with severe vomiting  Nausea with severe vomiting Nausea with severe vomiting Nausea with severe vomiting   Medications:  Current Outpatient Medications:    amoxicillin -clavulanate (AUGMENTIN ) 875-125 MG tablet, Take 1 tablet by mouth 2 (two) times daily., Disp: 20 tablet, Rfl: 0   fluconazole  (DIFLUCAN ) 150 MG tablet, Take 1 tablet (150 mg total) by mouth every 3 (three) days as  needed., Disp: 3 tablet, Rfl: 0   albuterol  (VENTOLIN  HFA) 108 (90 Base) MCG/ACT inhaler, Inhale 2 puffs into the lungs every 6 (six) hours as needed for wheezing or shortness of breath., Disp: 16 g, Rfl: 2   cyclobenzaprine  (FLEXERIL ) 10 MG tablet, Take 1 tablet (10 mg total) by mouth 3 (three) times daily as needed for muscle spasms., Disp: 30 tablet, Rfl: 5   fluticasone  (FLONASE ) 50 MCG/ACT nasal spray, Place 1 spray into both nostrils 2 (two) times daily as needed for allergies or rhinitis., Disp: 16 g, Rfl: 3   gabapentin  (NEURONTIN ) 300 MG capsule, Take 1 capsule (300 mg total) by mouth 3 (three) times daily., Disp: 270 capsule, Rfl: 1   hydrochlorothiazide  (HYDRODIURIL ) 25 MG tablet, Take 1 tablet (25 mg total) by mouth daily., Disp: 90 tablet, Rfl: 3   losartan  (COZAAR ) 100 MG tablet, Take 1 tablet (100 mg total) by mouth daily., Disp: 90 tablet, Rfl: 3   rosuvastatin  (CRESTOR ) 5 MG tablet, Take 1 tablet (5 mg total) by mouth daily., Disp: 90 tablet, Rfl: 3  Observations/Objective: Patient is well-developed, well-nourished in no acute distress.  Resting comfortably at home.  Head is normocephalic, atraumatic.  No labored breathing.  Speech is clear and coherent with logical content.  Patient is alert and oriented at baseline.    Assessment and Plan: 1. Acute bacterial sinusitis (Primary) - amoxicillin -clavulanate (AUGMENTIN ) 875-125 MG tablet; Take 1 tablet by mouth 2 (two) times daily.  Dispense: 20 tablet; Refill: 0  2. Antibiotic-induced yeast infection - fluconazole  (DIFLUCAN ) 150 MG tablet; Take 1 tablet (150 mg total) by mouth every 3 (three) days as needed.  Dispense: 3 tablet; Refill: 0  - Worsening symptoms that have not responded to OTC medications.  - Will give Augmentin  - Continue allergy medications  - Can continue Mucinex as needed - Steam and humidifier can help - Stay well hydrated and get plenty of rest.  - Diflucan  given as prophylaxis as patient tends to get  vaginal yeast infections with antibiotic use - Seek in person evaluation if no symptom improvement or if symptoms worsen   Follow Up Instructions: I discussed the assessment and treatment plan with the patient. The patient was provided an opportunity to ask questions and all were answered. The patient agreed with the plan and demonstrated an understanding of the instructions.  A copy of instructions were sent to the patient via MyChart unless otherwise noted below.    The patient was advised to call back or seek an in-person evaluation if the symptoms worsen or if the condition fails to improve as anticipated.    Kaitlin [name], [name]

## 2024-04-13 NOTE — Telephone Encounter (Signed)
 FYI Only or Action Required?: FYI only for provider.  Patient was last seen in primary care on 04/06/2024 by [name], [name], MD.  Called Nurse Triage reporting Sinusitis.  Symptoms began a week ago.  Interventions attempted: Rest, hydration, or home remedies.  Symptoms are: gradually worsening.  Triage Disposition: See PCP When Office is Open (Within 3 Days)  Patient/caregiver understands and will follow disposition?: Yes, will follow disposition  Pain in sinuses for about 1.5 weeks. 3/10 pain. Hx of sinus infections, feels the same. + nasal congestions, using neti pots-coming out green/yellow. Denies fever. + sore throat, HA, cough. Denies SOB. Scheduled VV, pt feels comfortable using the telehealth.   Copied from CRM [id number]. Topic: Clinical - Red Word Triage >> Apr 13, 2024  3:59 PM Willma R wrote: Red Word that prompted transfer to Nurse Triage: Patient thinks she has a sinus infection. Has had symptoms for more than a week, sinus pain, chills, headache, sore throat, congestions, and cough. Reason for Disposition  [1] Sinus congestion (pressure, fullness) AND [2] present > 10 days  Protocols used: Sinus Pain or Congestion-A-AH

## 2024-04-13 NOTE — Patient Instructions (Signed)
 Kaitlin Evans, thank you for joining [name], [name] for today's virtual visit.  While this provider is not your primary care provider (PCP), if your PCP is located in our provider database this encounter information will be shared with them immediately following your visit.   A [facility] MyChart account gives you access to today's visit and all your visits, tests, and labs performed at [facility]  click here if you don't have a [facility] MyChart account or go to [email or web address]  Consent: (Patient) Kaitlin Evans provided verbal consent for this virtual visit at the beginning of the encounter.  Current Medications:  Current Outpatient Medications:    amoxicillin -clavulanate (AUGMENTIN ) 875-125 MG tablet, Take 1 tablet by mouth 2 (two) times daily., Disp: 20 tablet, Rfl: 0   fluconazole  (DIFLUCAN ) 150 MG tablet, Take 1 tablet (150 mg total) by mouth every 3 (three) days as needed., Disp: 3 tablet, Rfl: 0   albuterol  (VENTOLIN  HFA) 108 (90 Base) MCG/ACT inhaler, Inhale 2 puffs into the lungs every 6 (six) hours as needed for wheezing or shortness of breath., Disp: 16 g, Rfl: 2   cyclobenzaprine  (FLEXERIL ) 10 MG tablet, Take 1 tablet (10 mg total) by mouth 3 (three) times daily as needed for muscle spasms., Disp: 30 tablet, Rfl: 5   fluticasone  (FLONASE ) 50 MCG/ACT nasal spray, Place 1 spray into both nostrils 2 (two) times daily as needed for allergies or rhinitis., Disp: 16 g, Rfl: 3   gabapentin  (NEURONTIN ) 300 MG capsule, Take 1 capsule (300 mg total) by mouth 3 (three) times daily., Disp: 270 capsule, Rfl: 1   hydrochlorothiazide  (HYDRODIURIL ) 25 MG tablet, Take 1 tablet (25 mg total) by mouth daily., Disp: 90 tablet, Rfl: 3   losartan  (COZAAR ) 100 MG tablet, Take 1 tablet (100 mg total) by mouth daily., Disp: 90 tablet, Rfl: 3   rosuvastatin  (CRESTOR ) 5 MG tablet, Take 1 tablet (5 mg total) by mouth daily., Disp: 90 tablet, Rfl: 3   Medications  ordered in this encounter:  Meds ordered this encounter  Medications   amoxicillin -clavulanate (AUGMENTIN ) 875-125 MG tablet    Sig: Take 1 tablet by mouth 2 (two) times daily.    Dispense:  20 tablet    Refill:  0    Supervising Provider:   LAMPTEY, PHILIP O [[id number]]   fluconazole  (DIFLUCAN ) 150 MG tablet    Sig: Take 1 tablet (150 mg total) by mouth every 3 (three) days as needed.    Dispense:  3 tablet    Refill:  0    Supervising Provider:   LAMPTEY, PHILIP O [[id number]]     *If you need refills on other medications prior to your next appointment, please contact your pharmacy*  Follow-Up: Call back or seek an in-person evaluation if the symptoms worsen or if the condition fails to improve as anticipated.  [facility] [phone]  Other Instructions Sinus Infection, Adult A sinus infection, also called sinusitis, is inflammation of your sinuses. Sinuses are hollow spaces in the bones around your face. Your sinuses are located: Around your eyes. In the middle of your forehead. Behind your nose. In your cheekbones. Mucus normally drains out of your sinuses. When your nasal tissues become inflamed or swollen, mucus can become trapped or blocked. This allows bacteria, viruses, and fungi to grow, which leads to infection. Most infections of the sinuses are caused by a virus. A sinus infection can develop quickly. It can last for up to 4 weeks (acute) or for  more than 12 weeks (chronic). A sinus infection often develops after a cold. What are the causes? This condition is caused by anything that creates swelling in the sinuses or stops mucus from draining. This includes: Allergies. Asthma. Infection from bacteria or viruses. Deformities or blockages in your nose or sinuses. Abnormal growths in the nose (nasal polyps). Pollutants, such as chemicals or irritants in the air. Infection from fungi. This is rare. What increases the risk? You are more likely to develop  this condition if you: Have a weak body defense system (immune system). Do a lot of swimming or diving. Overuse nasal sprays. Smoke. What are the signs or symptoms? The main symptoms of this condition are pain and a feeling of pressure around the affected sinuses. Other symptoms include: Stuffy nose or congestion that makes it difficult to breathe through your nose. Thick yellow or greenish drainage from your nose. Tenderness, swelling, and warmth over the affected sinuses. A cough that may get worse at night. Decreased sense of smell and taste. Extra mucus that collects in the throat or the back of the nose (postnasal drip) causing a sore throat or bad breath. Tiredness (fatigue). Fever. How is this diagnosed? This condition is diagnosed based on: Your symptoms. Your medical history. A physical exam. Tests to find out if your condition is acute or chronic. This may include: Checking your nose for nasal polyps. Viewing your sinuses using a device that has a light (endoscope). Testing for allergies or bacteria. Imaging tests, such as an MRI or CT scan. In rare cases, a bone biopsy may be done to rule out more serious types of fungal sinus disease. How is this treated? Treatment for a sinus infection depends on the cause and whether your condition is chronic or acute. If caused by a virus, your symptoms should go away on their own within 10 days. You may be given medicines to relieve symptoms. They include: Medicines that shrink swollen nasal passages (decongestants). A spray that eases inflammation of the nostrils (topical intranasal corticosteroids). Rinses that help get rid of thick mucus in your nose (nasal saline washes). Medicines that treat allergies (antihistamines). Over-the-counter pain relievers. If caused by bacteria, your health care provider may recommend waiting to see if your symptoms improve. Most bacterial infections will get better without antibiotic medicine. You  may be given antibiotics if you have: A severe infection. A weak immune system. If caused by narrow nasal passages or nasal polyps, surgery may be needed. Follow these instructions at home: Medicines Take, use, or apply over-the-counter and prescription medicines only as told by your health care provider. These may include nasal sprays. If you were prescribed an antibiotic medicine, take it as told by your health care provider. Do not stop taking the antibiotic even if you start to feel better. Hydrate and humidify  Drink enough fluid to keep your urine pale yellow. Staying hydrated will help to thin your mucus. Use a cool mist humidifier to keep the humidity level in your home above 50%. Inhale steam for 10-15 minutes, 3-4 times a day, or as told by your health care provider. You can do this in the bathroom while a hot shower is running. Limit your exposure to cool or dry air. Rest Rest as much as possible. Sleep with your head raised (elevated). Make sure you get enough sleep each night. General instructions  Apply a warm, moist washcloth to your face 3-4 times a day or as told by your health care provider. This will  help with discomfort. Use nasal saline washes as often as told by your health care provider. Wash your hands often with soap and water to reduce your exposure to germs. If soap and water are not available, use hand sanitizer. Do not smoke. Avoid being around people who are smoking (secondhand smoke). Keep all follow-up visits. This is important. Contact a health care provider if: You have a fever. Your symptoms get worse. Your symptoms do not improve within 10 days. Get help right away if: You have a severe headache. You have persistent vomiting. You have severe pain or swelling around your face or eyes. You have vision problems. You develop confusion. Your neck is stiff. You have trouble breathing. These symptoms may be an emergency. Get help right away. Call  911. Do not wait to see if the symptoms will go away. Do not drive yourself to the hospital. Summary A sinus infection is soreness and inflammation of your sinuses. Sinuses are hollow spaces in the bones around your face. This condition is caused by nasal tissues that become inflamed or swollen. The swelling traps or blocks the flow of mucus. This allows bacteria, viruses, and fungi to grow, which leads to infection. If you were prescribed an antibiotic medicine, take it as told by your health care provider. Do not stop taking the antibiotic even if you start to feel better. Keep all follow-up visits. This is important. This information is not intended to replace advice given to you by your health care provider. Make sure you discuss any questions you have with your health care provider. Document Revised: 08/27/2021 Document Reviewed: 08/27/2021 Elsevier Patient Education  2024 Elsevier Inc.   If you have been instructed to have an in-person evaluation today at a local Urgent Care facility, please use the link below. It will take you to a list of all of our available [facility], including address, phone number and hours of operation. Please do not delay care.  [facility]  If you or a family member do not have a primary care provider, use the link below to schedule a visit and establish care. When you choose a [facility] primary care physician or advanced practice provider, you gain a long-term partner in health. Find a Primary Care Provider  Learn more about [facility]'s in-office and virtual care options: [facility] - Get Care Now

## 2024-04-14 NOTE — Telephone Encounter (Signed)
 Pt had telehealth video visit regarding this, will close encounter.

## 2024-05-05 ENCOUNTER — Ambulatory Visit: Payer: Self-pay

## 2024-05-05 NOTE — Telephone Encounter (Signed)
 FYI Only or Action Required?: FYI only for provider.  Patient was last seen in primary care on 04/06/2024 by [name], [name], MD.  Called Nurse Triage reporting Knee Pain.  Symptoms began several weeks ago.  Interventions attempted: Prescription medications: gabapentin  and muscle relaxers, Rest, hydration, or home remedies, and Ice/heat application.  Symptoms are: unchanged.  Triage Disposition: See Physician Within 24 Hours  Patient/caregiver understands and will follow disposition?: Yes       Copied from CRM #[id number]. Topic: Clinical - Red Word Triage >> May 05, 2024 10:24 AM Montie POUR wrote: Red Word that prompted transfer to Nurse Triage:  She has done something to her right knee. It is swollen and red. Her pain can go up to a level 8 Reason for Disposition  [1] Redness of the skin AND [2] no fever  Answer Assessment - Initial Assessment Questions 1. LOCATION: Where is the swelling located?  (e.g., left, right, both knees)     Right knee 2. ONSET: When did the swelling start? Does it come and go, or is it there all the time?     A little over 2 weeks ago  3. SWELLING: How bad is the swelling? Or, How large is it? (e.g., mild, moderate, severe; size of localized swelling)      Mild  4. PAIN: Is there any pain? If Yes, ask: How bad is it? (Scale 0-10; or none, mild, moderate, severe)     mod 5. SETTING: Has there been any recent work, exercise or other activity that involved that part of the body?      no 6. AGGRAVATING FACTORS: What makes the knee swelling worse? (e.g., walking, climbing stairs, running)     Bending it and standing from sitting position 7. ASSOCIATED SYMPTOMS: Is there any pain or redness?     purplish 8. OTHER SYMPTOMS: Do you have any other symptoms? (e.g., calf pain, chest pain, difficulty breathing, fever)     Stiffness and pain  Protocols used: Knee Swelling-A-AH

## 2024-05-05 NOTE — Telephone Encounter (Signed)
 Apt scheduled.

## 2024-05-06 ENCOUNTER — Ambulatory Visit: Admitting: Family Medicine

## 2024-05-06 ENCOUNTER — Encounter: Payer: Self-pay | Admitting: Family Medicine

## 2024-05-06 VITALS — BP 123/82 | HR 95 | Temp 97.5°F | Ht 62.0 in | Wt 282.5 lb

## 2024-05-06 DIAGNOSIS — M25561 Pain in right knee: Secondary | ICD-10-CM

## 2024-05-06 MED ORDER — CYCLOBENZAPRINE HCL 10 MG PO TABS
10.0000 mg | ORAL_TABLET | Freq: Three times a day (TID) | ORAL | 5 refills | Status: AC | PRN
Start: 2024-05-06 — End: ?

## 2024-05-06 MED ORDER — NABUMETONE 750 MG PO TABS: 750.0000 mg | ORAL_TABLET | Freq: Two times a day (BID) | ORAL | 0 refills | Status: DC | PRN

## 2024-05-06 NOTE — Progress Notes (Signed)
 Subjective: RR:xwzz pain PCP: [name], [name], MD YEP:Fjmpjwwz Kaitlin Evans is a 53 y.o. female presenting to clinic today for:  1. Knee pain Reports onset of right knee pain after driving to [place]  2 and half weeks ago.  Symptoms seem to have progressively gotten worse.  She was on an oral prednisone  Dosepak for a sinus infection but that did not seem to help.  She has tried bracing with some relief but notes that it becomes uncomfortable after a while so she has not used it consistently.  She tried Motrin, Tylenol , Flexeril , ice, heat, topical NSAIDs and Biofreeze but nothing really helping.  Has had need for joint aspiration in the past.  Reports no fevers   ROS: Per HPI  Allergies  Allergen Reactions   Latex Rash   Sulfa Antibiotics Nausea And Vomiting and Other (See Comments)    Nausea with severe vomiting  Nausea with severe vomiting    Nausea with severe vomiting Nausea with severe vomiting Nausea with severe vomiting Nausea with severe vomiting   Past Medical History:  Diagnosis Date   Anxiety    Arthritis    Depression    Hypertension    Sleep apnea     Current Outpatient Medications:    albuterol  (VENTOLIN  HFA) 108 (90 Base) MCG/ACT inhaler, Inhale 2 puffs into the lungs every 6 (six) hours as needed for wheezing or shortness of breath., Disp: 16 g, Rfl: 2   cyclobenzaprine  (FLEXERIL ) 10 MG tablet, Take 1 tablet (10 mg total) by mouth 3 (three) times daily as needed for muscle spasms., Disp: 30 tablet, Rfl: 5   fluticasone  (FLONASE ) 50 MCG/ACT nasal spray, Place 1 spray into both nostrils 2 (two) times daily as needed for allergies or rhinitis., Disp: 16 g, Rfl: 3   gabapentin  (NEURONTIN ) 300 MG capsule, Take 1 capsule (300 mg total) by mouth 3 (three) times daily., Disp: 270 capsule, Rfl: 1   hydrochlorothiazide  (HYDRODIURIL ) 25 MG tablet, Take 1 tablet (25 mg total) by mouth daily., Disp: 90 tablet, Rfl: 3   losartan  (COZAAR ) 100 MG tablet, Take 1 tablet (100 mg  total) by mouth daily., Disp: 90 tablet, Rfl: 3   rosuvastatin  (CRESTOR ) 5 MG tablet, Take 1 tablet (5 mg total) by mouth daily., Disp: 90 tablet, Rfl: 3 Social History   Socioeconomic History   Marital status: Married    Spouse name: Not on file   Number of children: Not on file   Years of education: Not on file   Highest education level: Some college, no degree  Occupational History   Not on file  Tobacco Use   Smoking status: Every Day    Current packs/day: 0.00    Average packs/day: 0.5 packs/day for 18.0 years (9.0 ttl pk-yrs)    Types: Cigarettes    Start date: 04/10/1997    Last attempt to quit: 04/11/2015    Years since quitting: 9.0   Smokeless tobacco: Never  Vaping Use   Vaping status: Former  Substance and Sexual Activity   Alcohol use: Yes    Comment: occasional   Drug use: No   Sexual activity: Yes    Birth control/protection: Surgical    Comment: surgery endometrial ablation 2009  Other Topics Concern   Not on file  Social History Narrative   Not on file   Social Drivers of Health   Financial Resource Strain: Low Risk  (04/06/2024)   Overall Financial Resource Strain (CARDIA)    Difficulty of Paying Living Expenses: Not very  hard  Food Insecurity: No Food Insecurity (04/06/2024)   Hunger Vital Sign    Worried About Running Out of Food in the Last Year: Never true    Ran Out of Food in the Last Year: Never true  Transportation Needs: No Transportation Needs (04/06/2024)   PRAPARE - Administrator, Civil Service (Medical): No    Lack of Transportation (Non-Medical): No  Physical Activity: Insufficiently Active (04/06/2024)   Exercise Vital Sign    Days of Exercise per Week: 1 day    Minutes of Exercise per Session: 10 min  Stress: No Stress Concern Present (04/06/2024)   [employer] of Occupational Health - Occupational Stress Questionnaire    Feeling of Stress: Not at all  Social Connections: Socially Integrated (04/06/2024)   Social Connection  and Isolation Panel    Frequency of Communication with Friends and Family: More than three times a week    Frequency of Social Gatherings with Friends and Family: Once a week    Attends Religious Services: 1 to 4 times per year    Active Member of [employer] or Organizations: Yes    Attends Engineer, structural: More than 4 times per year    Marital Status: Married  Catering manager Violence: Unknown (01/10/2022)   Received from [facility]   HITS    Physically Hurt: Not on file    Insult or Talk Down To: Not on file    Threaten Physical Harm: Not on file    Scream or Curse: Not on file   Family History  Problem Relation Age of Onset   Hypertension Mother    Early death Father    Hypertension Brother    Diabetes Brother    Hypertension Maternal Grandmother    Hypertension Paternal Grandmother    Heart disease Paternal Grandfather    Early death Paternal Grandfather     Objective: Office vital signs reviewed. BP 123/82   Pulse 95   Temp (!) 97.5 F (36.4 C)   Ht 5' 2 (1.575 m)   Wt 282 lb 8 oz (128.1 kg)   SpO2 95%   BMI 51.67 kg/m   Physical Examination:  General: Awake, alert, obese, No acute distress MSK: Ambulating independently.  Gait slightly antalgic.  Right knee with visible joint effusion.  Mild fullness in the posterior popliteal fossa on the right.  No warmth, erythema.  Negative Thessaly.  No ligamentous laxity  Assessment/ Plan: 53 y.o. female   Acute pain of right knee - Plan: nabumetone (RELAFEN) 750 MG tablet, cyclobenzaprine  (FLEXERIL ) 10 MG tablet, AMB referral to orthopedics  Joint effusion certainly present on exam.  She has slight fullness in the posterior popliteal fossa but not in overt Baker's cyst palpable.  She had negative Thessaly on exam.  Stat referral to orthopedics for drainage of the knee and possible corticosteroid injection.  In the meantime nabumetone.  Avoid other NSAIDs.  Flexeril  also renewed   Kaitlin CHRISTELLA Fielding,  DO [facility] [phone]

## 2024-05-12 ENCOUNTER — Ambulatory Visit (HOSPITAL_COMMUNITY)
Admission: RE | Admit: 2024-05-12 | Discharge: 2024-05-12 | Disposition: A | Discharge status: Home / Self Care | Source: Ambulatory Visit | Attending: Orthopaedic Surgery | Admitting: Orthopaedic Surgery

## 2024-05-12 ENCOUNTER — Ambulatory Visit (INDEPENDENT_AMBULATORY_CARE_PROVIDER_SITE_OTHER)

## 2024-05-12 ENCOUNTER — Ambulatory Visit: Admitting: Orthopaedic Surgery

## 2024-05-12 ENCOUNTER — Encounter: Payer: Self-pay | Admitting: Orthopaedic Surgery

## 2024-05-12 DIAGNOSIS — M2391 Unspecified internal derangement of right knee: Secondary | ICD-10-CM | POA: Diagnosis not present

## 2024-05-12 DIAGNOSIS — M25561 Pain in right knee: Secondary | ICD-10-CM | POA: Diagnosis not present

## 2024-05-12 DIAGNOSIS — M1711 Unilateral primary osteoarthritis, right knee: Secondary | ICD-10-CM | POA: Diagnosis not present

## 2024-05-12 NOTE — Progress Notes (Signed)
 Subjective:    Patient ID: Kaitlin Evans, female    DOB: 1970/10/19, 53 y.o.   MRN: [id number]  HPI She has had pain in the right knee for over two weeks now.  She has swelling and popping but more significantly, she has locking of the knee at night that awakens her.  She cannot fully extend the knee while in bed and has to try to stand and then work it loose.  She has no trauma.  She has no redness.  The knee will give way at times during the day.  She has not fallen yet.  She has tried Tylenol , Advil, heat and ice without help.  She saw her family doctor and was told to see us .  No X-rays have been done.  She has no other joint problem.   Review of Systems  Constitutional:  Positive for activity change.  Musculoskeletal:  Positive for arthralgias, gait problem and joint swelling.  All other systems reviewed and are negative. For Review of Systems, all other systems reviewed and are negative.  The following is a summary of the past history medically, past history surgically, known current medicines, social history and family history.  This information is gathered electronically by the computer from prior information and documentation.  I review this each visit and have found including this information at this point in the chart is beneficial and informative.   Past Medical History:  Diagnosis Date   Anxiety    Arthritis    Depression    Hypertension    Sleep apnea     Past Surgical History:  Procedure Laterality Date   ABLATION  2009   CESAREAN SECTION  2005   CESAREAN SECTION  2008   D & C  2004   SALIVARY GLAND SURGERY     stones, had it removed    Current Outpatient Medications on File Prior to Visit  Medication Sig Dispense Refill   albuterol  (VENTOLIN  HFA) 108 (90 Base) MCG/ACT inhaler Inhale 2 puffs into the lungs every 6 (six) hours as needed for wheezing or shortness of breath. 16 g 2   cyclobenzaprine  (FLEXERIL ) 10 MG tablet Take 1 tablet (10 mg total) by  mouth 3 (three) times daily as needed for muscle spasms. 30 tablet 5   DULoxetine  (CYMBALTA ) 30 MG capsule Take 30 mg by mouth daily.     fluticasone  (FLONASE ) 50 MCG/ACT nasal spray Place 1 spray into both nostrils 2 (two) times daily as needed for allergies or rhinitis. 16 g 3   gabapentin  (NEURONTIN ) 300 MG capsule Take 1 capsule (300 mg total) by mouth 3 (three) times daily. 270 capsule 1   hydrochlorothiazide  (HYDRODIURIL ) 25 MG tablet Take 1 tablet (25 mg total) by mouth daily. 90 tablet 3   losartan  (COZAAR ) 100 MG tablet Take 1 tablet (100 mg total) by mouth daily. 90 tablet 3   nabumetone  (RELAFEN ) 750 MG tablet Take 1 tablet (750 mg total) by mouth 2 (two) times daily as needed for moderate pain (pain score 4-6) (no other nsaids while taking). 30 tablet 0   rosuvastatin  (CRESTOR ) 5 MG tablet Take 1 tablet (5 mg total) by mouth daily. 90 tablet 3   No current facility-administered medications on file prior to visit.    Social History   Socioeconomic History   Marital status: Married    Spouse name: Not on file   Number of children: Not on file   Years of education: Not on file   Highest education  level: Some college, no degree  Occupational History   Not on file  Tobacco Use   Smoking status: Every Day    Current packs/day: 0.00    Average packs/day: 0.5 packs/day for 18.0 years (9.0 ttl pk-yrs)    Types: Cigarettes    Start date: 04/10/1997    Last attempt to quit: 04/11/2015    Years since quitting: 9.0   Smokeless tobacco: Never  Vaping Use   Vaping status: Former  Substance and Sexual Activity   Alcohol use: Yes    Comment: occasional   Drug use: No   Sexual activity: Yes    Birth control/protection: Surgical    Comment: surgery endometrial ablation 2009  Other Topics Concern   Not on file  Social History Narrative   Not on file   Social Drivers of Health   Financial Resource Strain: Low Risk  (04/06/2024)   Overall Financial Resource Strain (CARDIA)     Difficulty of Paying Living Expenses: Not very hard  Food Insecurity: No Food Insecurity (04/06/2024)   Hunger Vital Sign    Worried About Running Out of Food in the Last Year: Never true    Ran Out of Food in the Last Year: Never true  Transportation Needs: No Transportation Needs (04/06/2024)   PRAPARE - Administrator, Civil Service (Medical): No    Lack of Transportation (Non-Medical): No  Physical Activity: Insufficiently Active (04/06/2024)   Exercise Vital Sign    Days of Exercise per Week: 1 day    Minutes of Exercise per Session: 10 min  Stress: No Stress Concern Present (04/06/2024)   [employer] of Occupational Health - Occupational Stress Questionnaire    Feeling of Stress: Not at all  Social Connections: Socially Integrated (04/06/2024)   Social Connection and Isolation Panel    Frequency of Communication with Friends and Family: More than three times a week    Frequency of Social Gatherings with Friends and Family: Once a week    Attends Religious Services: 1 to 4 times per year    Active Member of [employer] or Organizations: Yes    Attends Engineer, structural: More than 4 times per year    Marital Status: Married  Catering manager Violence: Unknown (01/10/2022)   Received from [facility]   HITS    Physically Hurt: Not on file    Insult or Talk Down To: Not on file    Threaten Physical Harm: Not on file    Scream or Curse: Not on file    Family History  Problem Relation Age of Onset   Hypertension Mother    Early death Father    Hypertension Brother    Diabetes Brother    Hypertension Maternal Grandmother    Hypertension Paternal Grandmother    Heart disease Paternal Grandfather    Early death Paternal Grandfather     There were no vitals taken for this visit.  There is no height or weight on file to calculate BMI.      Objective:   Physical Exam Vitals and nursing note reviewed. Exam conducted with a chaperone present.   Constitutional:      Appearance: She is well-developed.  HENT:     Head: Normocephalic and atraumatic.  Eyes:     Conjunctiva/sclera: Conjunctivae normal.     Pupils: Pupils are equal, round, and reactive to light.  Cardiovascular:     Rate and Rhythm: Normal rate and regular rhythm.  Pulmonary:  Effort: Pulmonary effort is normal.  Abdominal:     Palpations: Abdomen is soft.  Musculoskeletal:     Cervical back: Normal range of motion and neck supple.       Legs:  Skin:    General: Skin is warm and dry.  Neurological:     Mental Status: She is alert and oriented to person, place, and time.     Cranial Nerves: No cranial nerve deficit.     Motor: No abnormal muscle tone.     Coordination: Coordination normal.     Deep Tendon Reflexes: Reflexes are normal and symmetric. Reflexes normal.  Psychiatric:        Behavior: Behavior normal.        Thought Content: Thought content normal.        Judgment: Judgment normal.    X-rays were done of the right knee, reported separately.  She has tricompartmental degenerative joint disease, medial narrowing, no loose body.       Assessment & Plan:   Encounter Diagnoses  Name Primary?   Acute pain of right knee Yes   Locking of right knee    I am concerned she has tear of meniscus.  This would cause the locking.  Her insurance usually requires PT before MRI but I do not think this will resolve her problem and the locking.  In fact, it could make it worse.  We will schedule PT but will try to get MRI without the PT.  She will most likely need arthroscopy of the right knee.  Return in three weeks, earlier if we can get the MRI soon.  Call if any problem.  Precautions discussed.  Electronically Signed Lemond Stable, MD 8/7/20259:35 AM

## 2024-05-17 ENCOUNTER — Ambulatory Visit: Admitting: Orthopedic Surgery

## 2024-05-18 ENCOUNTER — Ambulatory Visit: Admitting: Orthopedic Surgery

## 2024-05-18 ENCOUNTER — Encounter: Payer: Self-pay | Admitting: Orthopedic Surgery

## 2024-05-18 VITALS — BP 142/89 | HR 113 | Ht 62.0 in | Wt 283.0 lb

## 2024-05-18 DIAGNOSIS — M1711 Unilateral primary osteoarthritis, right knee: Secondary | ICD-10-CM

## 2024-05-18 DIAGNOSIS — M25561 Pain in right knee: Secondary | ICD-10-CM

## 2024-05-18 MED ORDER — NABUMETONE 750 MG PO TABS: 750.0000 mg | ORAL_TABLET | Freq: Two times a day (BID) | ORAL | 5 refills | Status: AC | PRN

## 2024-05-18 NOTE — Progress Notes (Signed)
   BP (!) 142/89   Pulse (!) 113   Ht 5' 2 (1.575 m)   Wt 283 lb (128.4 kg)   BMI 51.76 kg/m   Body mass index is 51.76 kg/m.  Chief Complaint  Patient presents with   Knee Pain    Right/ surgical consult     No diagnosis found.  DOI/DOS/ Date: started July   Unchanged

## 2024-05-18 NOTE — Patient Instructions (Addendum)
 We will check which brand of Hyaluronic acid injections (there are several) your insurance covers. We will call you with price and schedule with you if insurance approves and you are okay with the out of pocket costs. If for any reason they will not cover or the out of pocket costs are high, we will discuss with you and let you know other options available. This process normally takes several weeks to hear back from us  the insurance approval process takes time.    Physical therapy has been ordered for you at [facility] in [place]  They should call you to schedule

## 2024-05-18 NOTE — Progress Notes (Signed)
 Office Visit Note   Patient: Kaitlin Evans           Date of Birth: 08-Jun-1971           MRN: [id number] Visit Date: 05/18/2024 Requested by: Kaitlin Evans [address] [place],  [place] [address] PCP: [name], Evans LABOR, Evans   Assessment & Plan:   Encounter Diagnosis  Name Primary?   Unilateral primary osteoarthritis, right knee Yes    No orders of the defined types were placed in this encounter.   53 year old female presented for possible surgical intervention regarding her pain in the right knee.  Patient played softball as a youngster complains of intermittent pain in her right knee over the years but had increased pain after driving to [place]  no injury.  The patient received a cortisone shot last Thursday and made significant improvement we recommend that she continue Relafen , start physical therapy, obtain approval for HA injections and wear economy hinged brace  At this point I think her pain is coming from her arthritis her meniscus is no longer functioning it is extruded from the joint she has a partial tear undersurface which I do not think can be repaired in the setting of obesity and her alignment.  It is probably not causing her symptoms.  She will reappear when her HA injections are approved we will send in prescription for nabumetone   Subjective: Chief Complaint  Patient presents with   Knee Pain    Right/ surgical consult     HPI: 53 year old female presented for possible surgical intervention regarding her pain in the right knee.  Patient played softball as a youngster complains of intermittent pain in her right knee over the years but had increased pain after driving to [place]  no injury.  The patient received a cortisone shot last Thursday and made significant improvement               ROS: Patient with history of fibromyalgia, no other major symptoms positive on review of systems   Images personally read and my interpretation : Prior  imaging reveals osteoarthritis of the right knee  She had an MRI which I read as extrusion of medial meniscus partial undersurface tear near the root 3 compartment degenerative changes  Visit Diagnoses:  1. Unilateral primary osteoarthritis, right knee      Follow-Up Instructions: No follow-ups on file.    Objective: Vital Signs: BP (!) 142/89   Pulse (!) 113   Ht 5' 2 (1.575 m)   Wt 283 lb (128.4 kg)   BMI 51.76 kg/m   Physical Exam Vitals and nursing note reviewed.  Constitutional:      General: She is not in acute distress.    Appearance: Normal appearance. She is obese. She is not ill-appearing, toxic-appearing or diaphoretic.  HENT:     Head: Normocephalic and atraumatic.     Nose: Nose normal. No congestion or rhinorrhea.  Eyes:     General: No scleral icterus.       Right eye: No discharge.        Left eye: No discharge.     Extraocular Movements: Extraocular movements intact.     Conjunctiva/sclera: Conjunctivae normal.     Pupils: Pupils are equal, round, and reactive to light.  Cardiovascular:     Pulses: Normal pulses.  Pulmonary:     Effort: Pulmonary effort is normal.     Breath sounds: No wheezing.  Musculoskeletal:     Comments: Examination of  the left knee shows that the knee has a normal skin envelope there is no tenderness around the joint no effusion range of motion is normal no instability detected muscle tone was normal  Right knee skin envelope was normal  The medial joint line was tender.  The patellofemoral joint had mild tenderness in the lateral compartment no tenderness.  No effusion detected.  Stiffness noted on flexion only up to 110 degrees extension was normal.  Knee was in mild varus.  Muscle tone normal.  No ligamentous instability detected.  Muscle tone normal.  Skin:    General: Skin is warm and dry.     Capillary Refill: Capillary refill takes less than 2 seconds.     Coloration: Skin is not jaundiced.     Findings: No erythema.   Neurological:     General: No focal deficit present.     Mental Status: She is alert and oriented to person, place, and time.  Psychiatric:        Mood and Affect: Mood normal.        Behavior: Behavior normal.        Thought Content: Thought content normal.        Judgment: Judgment normal.      Ortho Exam   Specialty Comments:  No specialty comments available.  Imaging: No results found.   PMFS History: Patient Active Problem List   Diagnosis Date Noted   Prediabetes 09/23/2023   Skin tag 04/07/2023   Fibromyalgia 08/16/2021   Depression, major, single episode, mild (HCC) 07/02/2018   Morbid obesity (HCC) 12/31/2017   Hypertension 07/27/2017   Low back pain 07/27/2017   Anxiety 07/27/2017   Past Medical History:  Diagnosis Date   Anxiety    Arthritis    Depression    Hypertension    Sleep apnea     Family History  Problem Relation Age of Onset   Hypertension Mother    Early death Father    Hypertension Brother    Diabetes Brother    Hypertension Maternal Grandmother    Hypertension Paternal Grandmother    Heart disease Paternal Grandfather    Early death Paternal Grandfather     Past Surgical History:  Procedure Laterality Date   ABLATION  2009   CESAREAN SECTION  2005   CESAREAN SECTION  2008   D & C  2004   SALIVARY GLAND SURGERY     stones, had it removed   Social History   Occupational History   Not on file  Tobacco Use   Smoking status: Every Day    Current packs/day: 0.00    Average packs/day: 0.5 packs/day for 18.0 years (9.0 ttl pk-yrs)    Types: Cigarettes    Start date: 04/10/1997    Last attempt to quit: 04/11/2015    Years since quitting: 9.1   Smokeless tobacco: Never  Vaping Use   Vaping status: Former  Substance and Sexual Activity   Alcohol use: Yes    Comment: occasional   Drug use: No   Sexual activity: Yes    Birth control/protection: Surgical    Comment: surgery endometrial ablation 2009

## 2024-05-19 ENCOUNTER — Ambulatory Visit: Admitting: Orthopaedic Surgery

## 2024-05-25 ENCOUNTER — Telehealth: Payer: Self-pay

## 2024-05-25 NOTE — Telephone Encounter (Signed)
 VOB submitted for Orthovisc, right knee

## 2024-05-27 ENCOUNTER — Encounter: Payer: Self-pay | Admitting: Radiology

## 2024-06-01 ENCOUNTER — Other Ambulatory Visit: Payer: Self-pay

## 2024-06-01 ENCOUNTER — Encounter: Payer: Self-pay | Admitting: Physical Therapy

## 2024-06-01 ENCOUNTER — Ambulatory Visit: Attending: Orthopedic Surgery | Admitting: Physical Therapy

## 2024-06-01 DIAGNOSIS — M25661 Stiffness of right knee, not elsewhere classified: Secondary | ICD-10-CM | POA: Insufficient documentation

## 2024-06-01 DIAGNOSIS — M25561 Pain in right knee: Secondary | ICD-10-CM | POA: Diagnosis present

## 2024-06-01 DIAGNOSIS — M6281 Muscle weakness (generalized): Secondary | ICD-10-CM | POA: Insufficient documentation

## 2024-06-01 DIAGNOSIS — R2689 Other abnormalities of gait and mobility: Secondary | ICD-10-CM | POA: Diagnosis present

## 2024-06-01 DIAGNOSIS — M1711 Unilateral primary osteoarthritis, right knee: Secondary | ICD-10-CM | POA: Diagnosis not present

## 2024-06-01 NOTE — Therapy (Unsigned)
 OUTPATIENT PHYSICAL THERAPY LOWER EXTREMITY EVALUATION   Patient Name: Kaitlin Evans MRN: [id number] DOB:07/13/1971, 53 y.o., female Today's Date: 06/02/2024  END OF SESSION:  PT End of Session - 06/01/24 1607     Visit Number 1    Number of Visits 8    Date for PT Re-Evaluation 07/28/24    Authorization Type Aet[employer]    PT Start Time 1605    PT Stop Time 1645    PT Time Calculation (min) 40 min          Past Medical History:  Diagnosis Date   Anxiety    Arthritis    Depression    Hypertension    Sleep apnea    Past Surgical History:  Procedure Laterality Date   ABLATION  2009   CESAREAN SECTION  2005   CESAREAN SECTION  2008   D & C  2004   SALIVARY GLAND SURGERY     stones, had it removed   Patient Active Problem List   Diagnosis Date Noted   Prediabetes 09/23/2023   Skin tag 04/07/2023   Fibromyalgia 08/16/2021   Depression, major, single episode, mild (HCC) 07/02/2018   Morbid obesity (HCC) 12/31/2017   Hypertension 07/27/2017   Low back pain 07/27/2017   Anxiety 07/27/2017    PCP: [name], [name], MD  REFERRING PROVIDER: Margrette Taft BRAVO, MD  REFERRING DIAG: M17.11 (ICD-10-CM) - Unilateral primary osteoarthritis, right knee  THERAPY DIAG:  Acute pain of right knee  Stiffness of right knee, not elsewhere classified  Muscle weakness (generalized)  Other abnormalities of gait and mobility  Rationale for Evaluation and Treatment: Rehabilitation  ONSET DATE: July 2025  SUBJECTIVE:   SUBJECTIVE STATEMENT: Pt reports she has tears in her meniscus and bad arthritis. Pt states her R knee would hurt her while sleeping. Was given cortisone shot which did help. Pt states she drove 8 hours in a car and her leg hurt. Had felt a twinge while going down stairs that morning and thought it was fine but thinks this may have originally hurt it. Now getting L knee pain. Does feel the knee brace helps. Pt states she is able to go up stairs with a  reciprocal pattern but going down she has to use a step to pattern  PERTINENT HISTORY: Arthritis, fibromyalgia  PAIN:  Are you having pain? Yes: NPRS scale: 0 at rest, 6/10 at worst Pain location: Posterior R knee and medial knee Pain description: Aching pain in posterior knee, sharp in medial knee Aggravating factors: Leg straight, walking, bending it too far back Relieving factors: Hinged knee brace  PRECAUTIONS: None  RED FLAGS: None   WEIGHT BEARING RESTRICTIONS: No  FALLS:  Has patient fallen in last 6 months? 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 Has to care for mother-in-law (prepares meals)  PLOF: Independent  PATIENT GOALS: Improve pain, increase strength  NEXT MD VISIT: 07/08/24 with Dr. Maryanne  OBJECTIVE:  Note: Objective measures were completed at Evaluation unless otherwise noted.  DIAGNOSTIC FINDINGS: R knee 05/12/24 MRI IMPRESSION: 1. Degeneration of the posterior horn medial meniscus towards the meniscal root with a small undersurface tear at the root and peripheral meniscal extrusion. 2. Tricompartmental cartilage abnormalities as described above.  PATIENT SURVEYS:  Lower Extremity Functional Score: 20 / 80 = 25.0 %  COGNITION: Overall cognitive status: Within functional limits for tasks assessed     SENSATION: Occasionally in toes  EDEMA:  Mild edema anterolateral knee  MUSCLE LENGTH: Did not assess  POSTURE: No Significant postural limitations  PALPATION: TTP R medial knee and into adductors, slight tenderness in lateral hamstring, increased tenderness lateral/anterior knee  LOWER EXTREMITY ROM:  Active ROM Right eval Left eval  Hip flexion    Hip extension    Hip abduction    Hip adduction    Hip internal rotation     Hip external rotation    Knee flexion 98 pain posterior/lateral knee 115  Knee extension 0; pain posterior knee 5  Ankle dorsiflexion    Ankle plantarflexion    Ankle inversion    Ankle eversion     (Blank rows = not tested)  LOWER EXTREMITY MMT:  MMT Right eval Left eval  Hip flexion 4 pain 5  Hip extension 5 5  Hip abduction 3+ 4+  Hip adduction 3+ 5  Hip internal rotation 4 5  Hip external rotation 4 5  Knee flexion 4 pain 5  Knee extension 4+ pain 5  Ankle dorsiflexion    Ankle plantarflexion    Ankle inversion    Ankle eversion     (Blank rows = not tested)  LOWER EXTREMITY SPECIAL TESTS:  Hip special tests: Belvie (FABER) test: negative  FUNCTIONAL TESTS:  Did not assess -- will benefit from stair assessment and squat assessment  GAIT: Distance walked: Into clinic Assistive device utilized: None Level of assistance: Complete Independence Comments: Antalgic, widened BOS, decreased R weight shift and stance                                                                                                                                TREATMENT DATE: 06/01/24 Self care: ice around knee, heat for adductor muscles, POC and exam findings Iontophoresis patch 1: Medial knee, 4 hour patch, dexamethasone Therex: See HEP below    PATIENT EDUCATION:  Education details: Exam findings, initial HEP, POC, ionto Person educated: Patient Education method: Explanation, Demonstration, and Handouts Education comprehension: verbalized understanding, returned demonstration, and needs further education  HOME EXERCISE PROGRAM: Access Code: [email or web address] URL: [email or web address] Date: 06/01/2024 Prepared by: Nolie Bignell April Earnie Starring  Exercises - Seated Hip Abduction with Resistance  - 2-3 x daily - 7 x weekly - 1 sets - 10 reps - Seated Hip Adductor Stretch  - 2-3 x daily - 7 x weekly - 1 sets - 30 sec hold - Seated Quad Set  - 2-3 x daily - 7 x weekly - 1  sets - 10 reps - 3 sec hold  Patient Education - Ionto Patient Instructions  ASSESSMENT:  CLINICAL IMPRESSION: Patient is a 53 y.o. F who was seen today for physical therapy evaluation and treatment for R>L knee pain. Imaging is significant for meniscus tear and arthritis. Assessment demos decreased knee ROM, hypomobile patella, tender  and taut adductors as well as gross R LE weakness affecting tolerance to all activities. Pt reports increased pain along medial and lateral R knee by end of assessment -- provided ionto to try and address. Pt will benefit from PT to address these issues. Pt is worried about financial constraints due to high copay.   OBJECTIVE IMPAIRMENTS: Abnormal gait, decreased activity tolerance, decreased balance, decreased coordination, decreased endurance, decreased mobility, difficulty walking, decreased ROM, decreased strength, hypomobility, increased edema, increased fascial restrictions, increased muscle spasms, impaired flexibility, impaired sensation, improper body mechanics, postural dysfunction, obesity, and pain.   ACTIVITY LIMITATIONS: carrying, lifting, bending, sitting, standing, squatting, stairs, transfers, bed mobility, hygiene/grooming, and locomotion level  PARTICIPATION LIMITATIONS: meal prep, cleaning, laundry, shopping, community activity, occupation, and yard work  PERSONAL FACTORS: Age, Fitness, Past/current experiences, and Time since onset of injury/illness/exacerbation are also affecting patient's functional outcome.   REHAB POTENTIAL: Good  CLINICAL DECISION MAKING: Evolving/moderate complexity  EVALUATION COMPLEXITY: Moderate   GOALS: Goals reviewed with patient? Yes  SHORT TERM GOALS: Target date: 06/30/2024  Pt will be ind with initial HEP Baseline: Goal status: INITIAL  2.  Pt will have improved knee ROM to 0-105 deg without pain Baseline:  Goal status: INITIAL  3.  Pt will report reduced pain by >/=25% Baseline:  Goal status:  INITIAL  LONG TERM GOALS: Target date: 07/28/2024   Pt will be ind with management and progression of HEP Baseline:  Goal status: INITIAL  2.  Pt will report improved pain by >/=50% Baseline:  Goal status: INITIAL  3.  Pt will be able to ascend/descend 7 steps with normal reciprocal pattern for improved home mobility Baseline: Ascends reciprocally but descend with step to pattern per pt report Goal status: INITIAL  4.  Pt will have improved knee ROM to at least 0 - 110 deg with no pain Baseline: 0-98 Goal status: INITIAL  5.  Pt will will have improved LEFS to >/=29/80 to demo MCID Baseline: 20 Goal status: INITIAL    PLAN:  PT FREQUENCY: 1x/week or PRN per pt's financial constraints  PT DURATION: 8 weeks  PLANNED INTERVENTIONS: 97164- PT Re-evaluation, 97750- Physical Performance Testing, 97110-Therapeutic exercises, 97530- Therapeutic activity, 97112- Neuromuscular re-education, 97535- Self Care, 02859- Manual therapy, [id number]- Gait training, [phone]- Aquatic Therapy, [phone]- Electrical stimulation (unattended), 97016- Vasopneumatic device, [id number]- Ultrasound, [id number]- Ionotophoresis 4mg /ml Dexamethasone, 79439 (1-2 muscles), 20561 (3+ muscles)- Dry Needling, Patient/Family education, Balance training, Stair training, Taping, Joint mobilization, Cryotherapy, and Moist heat  PLAN FOR NEXT SESSION: Assess response to HEP. Advance quad and hip strengthening as tolerated. Assess and correct stair mechanics and squatting.    Maleena Eddleman April Ma L Zi Newbury, PT, DPT 06/02/2024, 9:53 AM

## 2024-06-17 ENCOUNTER — Ambulatory Visit: Admitting: Orthopedic Surgery

## 2024-06-17 DIAGNOSIS — M1711 Unilateral primary osteoarthritis, right knee: Secondary | ICD-10-CM

## 2024-06-17 MED ORDER — HYALURONAN 30 MG/2ML IX SOSY
30.0000 mg | PREFILLED_SYRINGE | INTRA_ARTICULAR | Status: AC
  Administered 2024-06-17 – 2024-07-06 (×3): 30 mg via INTRA_ARTICULAR

## 2024-06-17 NOTE — Addendum Note (Signed)
 Addended byBETHA JENEAN GREIG LELON on: 06/17/2024 11:03 AM   Modules accepted: Orders

## 2024-06-17 NOTE — Patient Instructions (Signed)
 Add tylenol  500 mg every 6 hrs   If you can apply ice packs to the knee during the day : once in the morning and once in the afternoon

## 2024-06-17 NOTE — Progress Notes (Signed)
 Chief Complaint  Patient presents with   Injections    Orthovisc #64    53 year old female with osteoarthritis of the knee presents for the first of 3 Orthovisc injections in the right knee for osteoarthritis Patient: Kaitlin Evans           Date of Birth: 08-22-1971           MRN: [id number] Visit Date: 06/17/2024 Requested by: Maryanne [name], MD [address] [place],  [place] [address] PCP: [name], [name], MD  Chief Complaint  Patient presents with   Injections    Orthovisc #1    Encounter Diagnosis  Name Primary?   Unilateral primary osteoarthritis, right knee Yes    The patient has consented to and requested hyaluronic acid injection     right  THE knee is prepped with alcohol and ethyl chloride  The injection is performed with a 21-gauge needle, via inferolateral approach  No complications were noted  Appropriate instructions post injection were given    Fu 1 week

## 2024-06-24 ENCOUNTER — Ambulatory Visit: Admitting: Orthopedic Surgery

## 2024-06-24 ENCOUNTER — Encounter: Payer: Self-pay | Admitting: Orthopedic Surgery

## 2024-06-24 DIAGNOSIS — M1711 Unilateral primary osteoarthritis, right knee: Secondary | ICD-10-CM | POA: Diagnosis not present

## 2024-06-24 NOTE — Progress Notes (Signed)
 Patient: Kaitlin Evans           Date of Birth: 05-26-1971           MRN: [id number] Visit Date: 06/24/2024 Requested by: [name], [name], MD [address] [place],  [place] [address] PCP: [name], [name], MD  Chief Complaint  Patient presents with   Injections    Right knee orthovisc 2    Encounter Diagnosis  Name Primary?   Unilateral primary osteoarthritis, right knee Yes    The patient has consented to and requested hyaluronic acid injection   MEDICATION: Orthovisc  right  THE knee is prepped with alcohol and ethyl chloride  The injection is performed with a 21-gauge needle, via inferolateral approach  No complications were noted  Appropriate instructions post injection were given  Return in 7 to 10 days

## 2024-06-27 ENCOUNTER — Ambulatory Visit: Attending: Orthopedic Surgery | Admitting: Physical Therapy

## 2024-07-01 ENCOUNTER — Ambulatory Visit: Admitting: Orthopedic Surgery

## 2024-07-06 ENCOUNTER — Encounter: Payer: Self-pay | Admitting: Orthopedic Surgery

## 2024-07-06 ENCOUNTER — Ambulatory Visit: Admitting: Orthopedic Surgery

## 2024-07-06 DIAGNOSIS — M1711 Unilateral primary osteoarthritis, right knee: Secondary | ICD-10-CM | POA: Diagnosis not present

## 2024-07-06 MED ORDER — TRAMADOL-ACETAMINOPHEN 37.5-325 MG PO TABS: 1.0000 | ORAL_TABLET | ORAL | 5 refills | Status: DC | PRN

## 2024-07-06 NOTE — Progress Notes (Signed)
   Chief Complaint  Patient presents with   Injections    Right knee Orthovisc #3   Patient: Kaitlin Evans           Date of Birth: Feb 22, 1971           MRN: [id number] Visit Date: 07/06/2024 Requested by: Maryanne [name], MD [address] [place],  [place] [address] PCP: [name], [name], MD  Chief Complaint  Patient presents with   Injections    Right knee Orthovisc #3    Encounter Diagnosis  Name Primary?   Unilateral primary osteoarthritis, right knee Yes    The patient has consented to and requested hyaluronic acid injection   MEDICATION: ORTHOVISC  right  THE knee is prepped with alcohol and ethyl chloride  The injection is performed with a 21-gauge needle, via inferolateral approach  No complications were noted  Appropriate instructions post injection were given   Meds ordered this encounter  Medications   traMADol-acetaminophen  (ULTRACET) 37.5-325 MG tablet    Sig: Take 1 tablet by mouth every 4 (four) hours as needed.    Dispense:  90 tablet    Refill:  5

## 2024-07-06 NOTE — Progress Notes (Signed)
    07/06/2024   Chief Complaint  Patient presents with   Injections    Right knee Orthovisc #3    No diagnosis found.  What pharmacy do you use ? _____[facility] ______________________  DOI/DOS/ Date: ongoing  Worse swelling pain medial knee/ popping getting worse

## 2024-07-08 ENCOUNTER — Ambulatory Visit: Admitting: Family Medicine

## 2024-07-11 ENCOUNTER — Encounter: Payer: Self-pay | Admitting: Family Medicine

## 2024-08-05 ENCOUNTER — Telehealth: Payer: Self-pay | Admitting: Radiology

## 2024-08-05 ENCOUNTER — Ambulatory Visit: Admitting: Orthopedic Surgery

## 2024-08-05 DIAGNOSIS — M1711 Unilateral primary osteoarthritis, right knee: Secondary | ICD-10-CM | POA: Diagnosis not present

## 2024-08-05 MED ORDER — METHYLPREDNISOLONE ACETATE 40 MG/ML IJ SUSP
40.0000 mg | Freq: Once | INTRAMUSCULAR | Status: AC
  Administered 2024-08-05: 40 mg via INTRA_ARTICULAR

## 2024-08-05 MED ORDER — TRAMADOL HCL 50 MG PO TABS: 50.0000 mg | ORAL_TABLET | Freq: Four times a day (QID) | ORAL | 5 refills | Status: AC | PRN

## 2024-08-05 NOTE — Progress Notes (Signed)
    08/05/2024   Chief Complaint  Patient presents with   Injections    Wants injection in right knee and a drainage. Swollen/pain. Waking up at night in pain.     Encounter Diagnosis  Name Primary?   Unilateral primary osteoarthritis, right knee Yes    What pharmacy do you use ? [facility]  DOI/DOS/ Date: 08/05/24  Did you get better, worse or no change (Answer below)   Worse   twinging and catches

## 2024-08-05 NOTE — Addendum Note (Signed)
 Addended by: MARCINE HUSBAND T on: 08/05/2024 10:48 AM   Modules accepted: Orders

## 2024-08-05 NOTE — Progress Notes (Signed)
   Chief Complaint  Patient presents with   Injections    Wants injection in right knee and a drainage. Swollen/pain. Waking up at night in pain.     Encounter Diagnosis  Name Primary?   Unilateral primary osteoarthritis, right knee Yes   Kaitlin Evans is 53 years old she has unilateral osteoarthritis of the right knee with a posterior root medial meniscus tear with extrusion and BMI is 50 she has had 3 hyaluronic acid injections comes in with pain and swelling especially at night right knee.  She is currently on nabumetone  twice a day Ultracet.  She says she is not getting good pain relief  She would like an aspiration and injection of the right knee  I need to reevaluate her knee to proceed with that  Evaluation of the right knee shows a moderate effusion 0 to 90 degree range of motion with no instability tenderness over the medial joint line with normal straight leg raise in terms of strength  I think is reasonable to do an aspiration injection  Procedure :  Aspiration and injection  verbal consent was obtained to aspirate and inject the  right  knee joint   Timeout was completed to confirm the site of aspiration and injection   Medication:  40 mg of Depo-Medrol  and 1% lidocaine 3 cc  Anesthesia was provided by ethyl chloride and the skin was prepped with alcohol.  After cleaning the skin with alcohol an 18-gauge needle was used to aspirate the right knee joint.  Fluid obtained: 20cc  Color: Yellow  We followed this by injection of 40 mg of Depo-Medrol  and 3 cc 1% lidocaine.  There were no complications. A sterile bandage was applied.  She felt immediate relief  Plan going forward  Tylenol  500 mg Q6  Stop Ultracet start tramadol 50 mg Q6  Continue nabumetone  twice a day  Note she cannot afford $95 per PT visit and stopped

## 2024-08-05 NOTE — Patient Instructions (Signed)
 Tramadol 50 mg every 6 hrs   Tylenol  500 mg every  hrs   Nabumetone  twice a day   You have received an injection of steroids into the joint. 15% of patients will have increased pain within the 24 hours postinjection.   This is transient and will go away.   We recommend that you use ice packs on the injection site for 20 minutes every 2 hours and extra strength Tylenol  2 tablets every 8 as needed until the pain resolves.  If you continue to have pain after taking the Tylenol  and using the ice please call the office for further instructions.

## 2024-08-05 NOTE — Telephone Encounter (Signed)
 Completed a prior authorization in cover my meds for Tramadol

## 2024-08-08 ENCOUNTER — Encounter: Payer: Self-pay | Admitting: Radiology

## 2024-08-08 ENCOUNTER — Ambulatory Visit: Admitting: Orthopedic Surgery

## 2024-08-18 ENCOUNTER — Ambulatory Visit: Admitting: Orthopedic Surgery

## 2024-08-20 ENCOUNTER — Other Ambulatory Visit: Payer: Self-pay | Admitting: *Deleted

## 2024-08-20 DIAGNOSIS — F419 Anxiety disorder, unspecified: Secondary | ICD-10-CM

## 2024-08-20 DIAGNOSIS — F320 Major depressive disorder, single episode, mild: Secondary | ICD-10-CM

## 2024-09-08 ENCOUNTER — Ambulatory Visit: Admitting: Orthopedic Surgery

## 2024-11-09 ENCOUNTER — Ambulatory Visit: Admitting: Family Medicine

## 2024-11-09 ENCOUNTER — Encounter: Payer: Self-pay | Admitting: Family Medicine

## 2024-11-09 VITALS — BP 121/83 | HR 93 | Ht 62.0 in | Wt 278.0 lb

## 2024-11-09 DIAGNOSIS — F5104 Psychophysiologic insomnia: Secondary | ICD-10-CM | POA: Diagnosis not present

## 2024-11-09 DIAGNOSIS — F320 Major depressive disorder, single episode, mild: Secondary | ICD-10-CM | POA: Diagnosis not present

## 2024-11-09 DIAGNOSIS — G4730 Sleep apnea, unspecified: Secondary | ICD-10-CM | POA: Diagnosis not present

## 2024-11-09 MED ORDER — TRAZODONE HCL 50 MG PO TABS: 25.0000 mg | ORAL_TABLET | Freq: Every evening | ORAL | 3 refills | Status: AC | PRN

## 2024-11-09 NOTE — Progress Notes (Signed)
 "  BP 121/83   Pulse 93   Ht 5' 2 (1.575 m)   Wt 278 lb (126.1 kg)   SpO2 97%   BMI 50.85 kg/m    Subjective:   Patient ID: Kaitlin Evans, female    DOB: 11-26-1970, 54 y.o.   MRN: [id number]  HPI: Donita Newland is a 54 y.o. female presenting on 11/09/2024 for Insomnia, Epistaxis, and Headache   Discussed the use of AI scribe software for clinical note transcription with the patient, who gave verbal consent to proceed.  History of Present Illness   Kaitlin Evans is a 54 year old female with fibromyalgia who presents with sleep disturbances and a recent episode of epistaxis.  Sleep disturbances - Frequent nocturnal awakenings between 1:00 and 3:00 AM, with difficulty returning to sleep until 4:00 or 4:30 AM - Sleep disruption occurs regardless of bedtime or daytime napping - Nocturia often prompts awakening; sometimes drinks water before returning to bed - History of sleep apnea with heavy snoring and persistent daytime fatigue - Previous sleep study completed, but no further treatment pursued  Pain and fibromyalgia symptoms - Chronic pain related to fibromyalgia and knee pain - Takes tramadol  50 mg at night for pain management, often with Tylenol  - Uncertain about the effectiveness of duloxetine  for fibromyalgia symptoms  Medication side effects - Currently taking duloxetine  30 mg at night, which causes daytime somnolence - Questions whether duloxetine  contributes to sleep disturbances - Has been on duloxetine  for a prolonged period and is unsure of ongoing benefit  Epistaxis - Recent sudden onset of nosebleed while driving, managed with napkins - No recent nasal congestion or sinus symptoms - Heaters running frequently, possibly contributing to nasal dryness  Allergy-like symptoms - Intermittent hives - No recent upper respiratory symptoms  Mood and psychosocial functioning - No anxiety or depression except for transient irritability related to  interpersonal stress - Generally good mood and enjoys her job, though it is demanding          Relevant past medical, surgical, family and social history reviewed and updated as indicated. Interim medical history since our last visit reviewed. Allergies and medications reviewed and updated.  Review of Systems  Constitutional:  Negative for chills and fever.  HENT:  Positive for nosebleeds. Negative for ear discharge and ear pain.   Eyes:  Negative for redness and visual disturbance.  Respiratory:  Negative for chest tightness and shortness of breath.   Cardiovascular:  Negative for chest pain and leg swelling.  Genitourinary:  Negative for difficulty urinating and dysuria.  Skin:  Negative for rash.  Neurological:  Negative for light-headedness and headaches.  Psychiatric/Behavioral:  Positive for sleep disturbance. Negative for agitation, behavioral problems, dysphoric mood, self-injury and suicidal ideas. The patient is nervous/anxious.   All other systems reviewed and are negative.   Per HPI unless specifically indicated above   Allergies as of 11/09/2024       Reactions   Latex Rash   Sulfa Antibiotics Nausea And Vomiting, Other (See Comments)   Nausea with severe vomiting Nausea with severe vomiting    Nausea with severe vomiting Nausea with severe vomiting Nausea with severe vomiting Nausea with severe vomiting        Medication List        Accurate as of November 09, 2024  4:37 PM. If you have any questions, ask your nurse or doctor.          STOP taking these medications    DULoxetine  30  MG capsule Commonly known as: CYMBALTA  Stopped by: [name] Levins, MD       TAKE these medications    albuterol  108 (90 Base) MCG/ACT inhaler Commonly known as: VENTOLIN  HFA Inhale 2 puffs into the lungs every 6 (six) hours as needed for wheezing or shortness of breath.   cyclobenzaprine  10 MG tablet Commonly known as: FLEXERIL  Take 1 tablet (10 mg total) by  mouth 3 (three) times daily as needed for muscle spasms.   fluticasone  50 MCG/ACT nasal spray Commonly known as: FLONASE  Place 1 spray into both nostrils 2 (two) times daily as needed for allergies or rhinitis.   gabapentin  300 MG capsule Commonly known as: Neurontin  Take 1 capsule (300 mg total) by mouth 3 (three) times daily.   hydrochlorothiazide  25 MG tablet Commonly known as: HYDRODIURIL  Take 1 tablet (25 mg total) by mouth daily.   losartan  100 MG tablet Commonly known as: COZAAR  Take 1 tablet (100 mg total) by mouth daily.   nabumetone  750 MG tablet Commonly known as: Relafen  Take 1 tablet (750 mg total) by mouth 2 (two) times daily as needed for moderate pain (pain score 4-6) (no other nsaids while taking).   rosuvastatin  5 MG tablet Commonly known as: Crestor  Take 1 tablet (5 mg total) by mouth daily.   traMADol  50 MG tablet Commonly known as: ULTRAM  Take 1 tablet (50 mg total) by mouth every 6 (six) hours as needed.   traZODone  50 MG tablet Commonly known as: DESYREL  Take 0.5-1 tablets (25-50 mg total) by mouth at bedtime as needed for sleep. Started by: [name] Levins, MD         Objective:   BP 121/83   Pulse 93   Ht 5' 2 (1.575 m)   Wt 278 lb (126.1 kg)   SpO2 97%   BMI 50.85 kg/m   Wt Readings from Last 3 Encounters:  11/09/24 278 lb (126.1 kg)  05/18/24 283 lb (128.4 kg)  05/06/24 282 lb 8 oz (128.1 kg)    Physical Exam Vitals and nursing note reviewed.  Constitutional:      General: She is not in acute distress.    Appearance: She is well-developed. She is not diaphoretic.  HENT:     Nose: Nose normal. No congestion or rhinorrhea.  Eyes:     Conjunctiva/sclera: Conjunctivae normal.     Pupils: Pupils are equal, round, and reactive to light.  Cardiovascular:     Rate and Rhythm: Normal rate and regular rhythm.     Heart sounds: Normal heart sounds. No murmur heard. Pulmonary:     Effort: Pulmonary effort is normal. No respiratory  distress.     Breath sounds: Normal breath sounds. No wheezing.  Musculoskeletal:        General: No tenderness. Normal range of motion.  Skin:    General: Skin is warm and dry.     Findings: No rash.  Neurological:     Mental Status: She is alert and oriented to person, place, and time.     Coordination: Coordination normal.  Psychiatric:        Behavior: Behavior normal.                 Assessment & Plan:   Problem List Items Addressed This Visit       Other   Depression, major, single episode, mild - Primary   Relevant Medications   traZODone  (DESYREL ) 50 MG tablet   Other Visit Diagnoses       Sleep  apnea, unspecified type       Relevant Orders   Ambulatory referral to Sleep Studies     Psychophysiological insomnia       Relevant Medications   traZODone  (DESYREL ) 50 MG tablet         Insomnia and obstructive sleep apnea Chronic insomnia with difficulty maintaining sleep, possibly exacerbated by obstructive sleep apnea. Current medications may contribute to sleep disturbances. Discussed trazodone  for sleep and anxiety management. - Discontinued duloxetine . - Initiated trazodone  50 mg at bedtime, may increase to 100 mg if needed. - Continue tramadol  as needed for pain. - Referred to sleep specialist for evaluation and potential sleep study.  Epistaxis Acute epistaxis likely due to dry nasal passages from environmental factors. - Use nasal saline for moisturization. - Consider using a humidifier in the room.  Fibromyalgia Chronic fibromyalgia with pain managed by tramadol  and Tylenol , providing adequate relief. - Continue current pain management regimen with tramadol  and Tylenol .          Follow up plan: Return if symptoms worsen or fail to improve, for 1 to 62-month follow-up anxiety and insomnia.  Counseling provided for all of the vaccine components Orders Placed This Encounter  Procedures   Ambulatory referral to Sleep Studies    [name] Levins, MD [facility] 11/09/2024, 4:37 PM     "
